# Patient Record
Sex: Male | Born: 1970
Health system: Southern US, Community
[De-identification: ages and names within clinical notes are randomized; demographics above are authoritative.]

## PROBLEM LIST (undated history)

## (undated) DIAGNOSIS — Z86718 Personal history of other venous thrombosis and embolism: Secondary | ICD-10-CM

## (undated) DIAGNOSIS — F32A Depression, unspecified: Secondary | ICD-10-CM

## (undated) DIAGNOSIS — G4730 Sleep apnea, unspecified: Secondary | ICD-10-CM

## (undated) DIAGNOSIS — R519 Headache, unspecified: Secondary | ICD-10-CM

## (undated) DIAGNOSIS — I10 Essential (primary) hypertension: Secondary | ICD-10-CM

## (undated) DIAGNOSIS — F329 Major depressive disorder, single episode, unspecified: Secondary | ICD-10-CM

## (undated) DIAGNOSIS — R011 Cardiac murmur, unspecified: Secondary | ICD-10-CM

## (undated) DIAGNOSIS — D689 Coagulation defect, unspecified: Secondary | ICD-10-CM

## (undated) DIAGNOSIS — R51 Headache: Secondary | ICD-10-CM

## (undated) DIAGNOSIS — I209 Angina pectoris, unspecified: Secondary | ICD-10-CM

## (undated) DIAGNOSIS — I639 Cerebral infarction, unspecified: Secondary | ICD-10-CM

## (undated) DIAGNOSIS — I499 Cardiac arrhythmia, unspecified: Secondary | ICD-10-CM

## (undated) DIAGNOSIS — F419 Anxiety disorder, unspecified: Secondary | ICD-10-CM

## (undated) DIAGNOSIS — R001 Bradycardia, unspecified: Secondary | ICD-10-CM

## (undated) DIAGNOSIS — T4145XA Adverse effect of unspecified anesthetic, initial encounter: Secondary | ICD-10-CM

## (undated) DIAGNOSIS — G709 Myoneural disorder, unspecified: Secondary | ICD-10-CM

## (undated) DIAGNOSIS — T8859XA Other complications of anesthesia, initial encounter: Secondary | ICD-10-CM

## (undated) DIAGNOSIS — I2699 Other pulmonary embolism without acute cor pulmonale: Secondary | ICD-10-CM

## (undated) HISTORY — DX: Coagulation defect, unspecified: D68.9

## (undated) HISTORY — PX: CARDIOVASCULAR STRESS TEST: SHX262

## (undated) HISTORY — PX: COSMETIC SURGERY: SHX468

## (undated) HISTORY — DX: Depression, unspecified: F32.A

## (undated) HISTORY — DX: Cardiac murmur, unspecified: R01.1

## (undated) HISTORY — PX: LEG SURGERY: SHX1003

## (undated) HISTORY — DX: Major depressive disorder, single episode, unspecified: F32.9

## (undated) HISTORY — PX: FRACTURE SURGERY: SHX138

---

## 2008-11-21 DIAGNOSIS — I639 Cerebral infarction, unspecified: Secondary | ICD-10-CM

## 2008-11-21 HISTORY — DX: Cerebral infarction, unspecified: I63.9

## 2012-02-02 ENCOUNTER — Emergency Department (HOSPITAL_COMMUNITY): Payer: Self-pay

## 2012-02-02 ENCOUNTER — Other Ambulatory Visit: Payer: Self-pay

## 2012-02-02 ENCOUNTER — Emergency Department (HOSPITAL_COMMUNITY)
Admission: EM | Admit: 2012-02-02 | Discharge: 2012-02-03 | Disposition: A | Discharge status: Home / Self Care | Payer: Self-pay | Attending: Emergency Medicine | Admitting: Emergency Medicine

## 2012-02-02 ENCOUNTER — Encounter (HOSPITAL_COMMUNITY): Payer: Self-pay | Admitting: Nurse Practitioner

## 2012-02-02 DIAGNOSIS — R609 Edema, unspecified: Secondary | ICD-10-CM

## 2012-02-02 DIAGNOSIS — R0789 Other chest pain: Secondary | ICD-10-CM | POA: Insufficient documentation

## 2012-02-02 DIAGNOSIS — R0602 Shortness of breath: Secondary | ICD-10-CM | POA: Insufficient documentation

## 2012-02-02 DIAGNOSIS — M7989 Other specified soft tissue disorders: Secondary | ICD-10-CM | POA: Insufficient documentation

## 2012-02-02 DIAGNOSIS — R42 Dizziness and giddiness: Secondary | ICD-10-CM | POA: Insufficient documentation

## 2012-02-02 DIAGNOSIS — R51 Headache: Secondary | ICD-10-CM | POA: Insufficient documentation

## 2012-02-02 LAB — DIFFERENTIAL
Basophils Relative: 0 % (ref 0–1)
Eosinophils Absolute: 0.2 10*3/uL (ref 0.0–0.7)
Eosinophils Relative: 3 % (ref 0–5)
Monocytes Absolute: 0.3 10*3/uL (ref 0.1–1.0)
Monocytes Relative: 5 % (ref 3–12)
Neutrophils Relative %: 49 % (ref 43–77)

## 2012-02-02 LAB — CBC
Hemoglobin: 13.8 g/dL (ref 13.0–17.0)
MCH: 32 pg (ref 26.0–34.0)
MCHC: 34.9 g/dL (ref 30.0–36.0)
MCV: 91.6 fL (ref 78.0–100.0)

## 2012-02-02 LAB — POCT I-STAT TROPONIN I

## 2012-02-02 LAB — PROTIME-INR: INR: 0.88 (ref 0.00–1.49)

## 2012-02-02 MED ORDER — IOHEXOL 350 MG/ML SOLN
80.0000 mL | Freq: Once | INTRAVENOUS | Status: AC | PRN
  Administered 2012-02-02: 80 mL via INTRAVENOUS

## 2012-02-02 MED ORDER — MORPHINE SULFATE 4 MG/ML IJ SOLN
4.0000 mg | Freq: Once | INTRAMUSCULAR | Status: AC
  Administered 2012-02-02: 4 mg via INTRAVENOUS
  Filled 2012-02-02: qty 1

## 2012-02-02 MED ORDER — NAPROXEN 500 MG PO TABS: 500.0000 mg | ORAL_TABLET | Freq: Two times a day (BID) | ORAL | Status: DC

## 2012-02-02 MED ORDER — LORAZEPAM 2 MG/ML IJ SOLN
1.0000 mg | Freq: Once | INTRAMUSCULAR | Status: AC
  Administered 2012-02-02: 1 mg via INTRAVENOUS
  Filled 2012-02-02: qty 1

## 2012-02-02 NOTE — ED Provider Notes (Signed)
Patient in CDU pending completion of workup for lower extremity swelling, left sided chest tightness, occasional shortness of breath.  Labs and radiology results reviewed and discussed with patient and family.  No abnormal findings.  Patient has a PCP in [place].  Patient to call Dr. Blanchie Dessert office tomorrow to schedule outpatient follow-up for atypical chest pain.  Jimmye Norman, NP 02/02/12 [phone]

## 2012-02-02 NOTE — Progress Notes (Signed)
VASCULAR LAB PRELIMINARY  PRELIMINARY  PRELIMINARY  PRELIMINARY  Right lower extremity venous duplex has been completed.    Preliminary report:  No obvious DVT or superficial vein thrombosis noted in the right lower extremity.   Vanna Scotland,  RVT 02/02/2012, 6:53 PM

## 2012-02-02 NOTE — Discharge Instructions (Signed)
Chest Pain (Nonspecific) It is often hard to give a specific diagnosis for the cause of chest pain. There is always a chance that your pain could be related to something serious, such as a heart attack or a blood clot in the lungs. You need to follow up with your caregiver for further evaluation. CAUSES   Heartburn.   Pneumonia or bronchitis.   Anxiety or stress.   Inflammation around your heart (pericarditis) or lung (pleuritis or pleurisy).   A blood clot in the lung.   A collapsed lung (pneumothorax). It can develop suddenly on its own (spontaneous pneumothorax) or from injury (trauma) to the chest.   Shingles infection (herpes zoster virus).  The chest wall is composed of bones, muscles, and cartilage. Any of these can be the source of the pain.  The bones can be bruised by injury.   The muscles or cartilage can be strained by coughing or overwork.   The cartilage can be affected by inflammation and become sore (costochondritis).  DIAGNOSIS  Lab tests or other studies, such as X-rays, electrocardiography, stress testing, or cardiac imaging, may be needed to find the cause of your pain.  TREATMENT   Treatment depends on what may be causing your chest pain. Treatment may include:   Acid blockers for heartburn.   Anti-inflammatory medicine.   Pain medicine for inflammatory conditions.   Antibiotics if an infection is present.   You may be advised to change lifestyle habits. This includes stopping smoking and avoiding alcohol, caffeine, and chocolate.   You may be advised to keep your head raised (elevated) when sleeping. This reduces the chance of acid going backward from your stomach into your esophagus.   Most of the time, nonspecific chest pain will improve within 2 to 3 days with rest and mild pain medicine.  HOME CARE INSTRUCTIONS   If antibiotics were prescribed, take your antibiotics as directed. Finish them even if you start to feel better.   For the next few  days, avoid physical activities that bring on chest pain. Continue physical activities as directed.   Do not smoke.   Avoid drinking alcohol.   Only take over-the-counter or prescription medicine for pain, discomfort, or fever as directed by your caregiver.   Follow your caregiver's suggestions for further testing if your chest pain does not go away.   Keep any follow-up appointments you made. If you do not go to an appointment, you could develop lasting (chronic) problems with pain. If there is any problem keeping an appointment, you must call to reschedule.  SEEK MEDICAL CARE IF:   You think you are having problems from the medicine you are taking. Read your medicine instructions carefully.   Your chest pain does not go away, even after treatment.   You develop a rash with blisters on your chest.  SEEK IMMEDIATE MEDICAL CARE IF:   You have increased chest pain or pain that spreads to your arm, neck, jaw, back, or abdomen.   You develop shortness of breath, an increasing cough, or you are coughing up blood.   You have severe back or abdominal pain, feel nauseous, or vomit.   You develop severe weakness, fainting, or chills.   You have a fever.  THIS IS AN EMERGENCY. Do not wait to see if the pain will go away. Get medical help at once. Call your local emergency services (911 in [place] Do not drive yourself to the hospital. MAKE SURE YOU:   Understand these instructions.     Will watch your condition.   Will get help right away if you are not doing well or get worse.  Document Released: 08/17/2005 Document Revised: 10/27/2011 Document Reviewed: 06/12/2008 [facility] Patient Information 2012 [place], [place].  Headache, General, Unknown Cause The specific cause of your headache may not have been found today. There are many causes and types of headache. A few common ones are:  Tension headache.   Migraine.   Infections (examples: dental and sinus infections).   Bone and/or  joint problems in the neck or jaw.   Depression.   Eye problems.  These headaches are not life threatening.  Headaches can sometimes be diagnosed by a patient history and a physical exam. Sometimes, lab and imaging studies (such as x-ray and/or CT scan) are used to rule out more serious problems. In some cases, a spinal tap (lumbar puncture) may be requested. There are many times when your exam and tests may be normal on the first visit even when there is a serious problem causing your headaches. Because of that, it is very important to follow up with your doctor or local clinic for further evaluation. FINDING OUT THE RESULTS OF TESTS  If a radiology test was performed, a radiologist will review your results.   You will be contacted by the emergency department or your physician if any test results require a change in your treatment plan.   Not all test results may be available during your visit. If your test results are not back during the visit, make an appointment with your caregiver to find out the results. Do not assume everything is normal if you have not heard from your caregiver or the medical facility. It is important for you to follow up on all of your test results.  HOME CARE INSTRUCTIONS   Keep follow-up appointments with your caregiver, or any specialist referral.   Only take over-the-counter or prescription medicines for pain, discomfort, or fever as directed by your caregiver.   Biofeedback, massage, or other relaxation techniques may be helpful.   Ice packs or heat applied to the head and neck can be used. Do this three to four times per day, or as needed.   Call your doctor if you have any questions or concerns.   If you smoke, you should quit.  SEEK MEDICAL CARE IF:   You develop problems with medications prescribed.   You do not respond to or obtain relief from medications.   You have a change from the usual headache.   You develop nausea or vomiting.  SEEK  IMMEDIATE MEDICAL CARE IF:   If your headache becomes severe.   You have an unexplained oral temperature above 102 F (38.9 C), or as your caregiver suggests.   You have a stiff neck.   You have loss of vision.   You have muscular weakness.   You have loss of muscular control.   You develop severe symptoms different from your first symptoms.   You start losing your balance or have trouble walking.   You feel faint or pass out.  MAKE SURE YOU:   Understand these instructions.   Will watch your condition.   Will get help right away if you are not doing well or get worse.  Document Released: 11/07/2005 Document Revised: 10/27/2011 Document Reviewed: 06/26/2008 [facility] Patient Information 2012 [place], [place].

## 2012-02-02 NOTE — ED Notes (Signed)
C/o cp and headache x 2 days. Has also felt SOB in the morning but then it goes away. Denies LOC, but has been dizzy. Reports pain is constant since onset. Also states L hand feels tingly. A&Ox4, speech is clear, grips = bilateral, ambulatory

## 2012-02-02 NOTE — ED Provider Notes (Addendum)
History     CSN: [id number]  Arrival date & time 02/02/12  1411   First MD Initiated Contact with Patient 02/02/12 1545      Chief Complaint  Patient presents with  . Chest Pain    (Consider location/radiation/quality/duration/timing/severity/associated sxs/prior treatment) HPI Complains of anterior chest pain left sided gradual in onset 3 days ago described as tightness with tingling in his left hand. Pain is nonradiating. Admits to shortness of breath, none now. Pain is not worse with exertion. Shortness of breath is somewhat worsened with sitting up. Also reports swelling of his right leg since yesterday, and headache for several days, diffuse presently headache is worse chest pain. No treatment prior to coming here no other associated symptoms. History reviewed. No pertinent past medical history. Past medical history DVT History reviewed. No pertinent past surgical history. Surgical history skin graft History reviewed. No pertinent family history. Crackers factors male gender otherwise negative History  Substance Use Topics  . Smoking status: Never Smoker   . Smokeless tobacco: Not on file  . Alcohol Use: Yes     social      Review of Systems  Respiratory: Positive for chest tightness and shortness of breath.   Neurological: Positive for dizziness and headaches.  All other systems reviewed and are negative.    Allergies  Review of patient's allergies indicates no known allergies.  Home Medications  No current outpatient prescriptions on file.  BP 156/94  Pulse 90  Temp(Src) 98.4 F (36.9 C) (Oral)  Resp 19  Ht 5\' 11"  (1.803 m)  Wt 210 lb (95.255 kg)  BMI 29.29 kg/m2  SpO2 98%  Physical Exam  Nursing note and vitals reviewed. Constitutional: He is oriented to person, place, and time. He appears well-developed and well-nourished.  HENT:  Head: Normocephalic and atraumatic.  Eyes: Conjunctivae are normal. Pupils are equal, round, and reactive to light.    Neck: Neck supple. No tracheal deviation present. No thyromegaly present.  Cardiovascular: Normal rate and regular rhythm.   No murmur heard. Pulmonary/Chest: Effort normal and breath sounds normal.  Abdominal: Soft. Bowel sounds are normal. He exhibits no distension. There is no tenderness.  Musculoskeletal: Normal range of motion. He exhibits no edema and no tenderness.       Right lower extremity is swollen below the knee nontender, neurovascular intact all other extremities without swelling redness or tenderness neurovascular intact  Neurological: He is alert and oriented to person, place, and time. No cranial nerve deficit. Coordination normal.  Skin: Skin is warm and dry. No rash noted.  Psychiatric: He has a normal mood and affect.    ED Course  Procedures (including critical care time)  Date: 02/02/2012  Rate: 85  Rhythm: normal sinus rhythm  QRS Axis: normal  Intervals: normal  ST/T Wave abnormalities: nonspecific ST/T changes  Conduction Disutrbances:none  Narrative Interpretation:   Old EKG Reviewed: none available  Labs Reviewed - No data to display No results found.   No diagnosis found.  MDM  Symptoms atypical for acute coronary syndrome admission male with only risk factor being male gender Concerning for hospital thromboembolic disease given history DVT and swollen leg Headache felt not to be nonspecific. In light of atypical symptoms nonspecific EKG feel that patient can have outpatient cardiac workup. Spoke with Dr.Hilty who agrees to see patient in close followup Dx#1 atypical chest pain #2 nonspecific headache          Doug Sou, MD 02/02/12 1631  Doug Sou, MD 02/02/12  2027 
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
## 2012-02-06 NOTE — ED Provider Notes (Signed)
Medical screening examination/treatment/procedure(s) were conducted as a shared visit with non-physician practitioner(s) and myself.  I personally evaluated the patient during the encounter  Doug Sou, MD 02/06/12 [phone]

## 2012-10-21 ENCOUNTER — Encounter (HOSPITAL_COMMUNITY): Payer: Self-pay | Admitting: *Deleted

## 2012-10-21 ENCOUNTER — Emergency Department (HOSPITAL_COMMUNITY)
Admission: EM | Admit: 2012-10-21 | Discharge: 2012-10-21 | Disposition: A | Discharge status: Home / Self Care | Payer: Self-pay | Attending: Emergency Medicine | Admitting: Emergency Medicine

## 2012-10-21 DIAGNOSIS — R51 Headache: Secondary | ICD-10-CM | POA: Insufficient documentation

## 2012-10-21 DIAGNOSIS — H73019 Bullous myringitis, unspecified ear: Secondary | ICD-10-CM | POA: Insufficient documentation

## 2012-10-21 MED ORDER — AZITHROMYCIN 250 MG PO TABS: ORAL_TABLET | ORAL | Status: DC

## 2012-10-21 MED ORDER — ANTIPYRINE-BENZOCAINE 5.4-1.4 % OT SOLN
3.0000 [drp] | Freq: Once | OTIC | Status: AC
  Administered 2012-10-21: 4 [drp] via OTIC
  Filled 2012-10-21: qty 10

## 2012-10-21 NOTE — ED Notes (Signed)
Pt reports having right ear and head pain  1 week.

## 2012-10-21 NOTE — ED Provider Notes (Signed)
History   This chart was scribed for Ethelda Chick, MD by Melba Coon, ED Scribe. The patient was seen in room TR10C/TR10C and the patient's care was started at 6:34PM.    CSN: [id number]  Arrival date & time 10/21/12  1630   None     Chief Complaint  Patient presents with  . Otalgia  . Headache    (Consider location/radiation/quality/duration/timing/severity/associated sxs/prior treatment) Patient is a 41 y.o. male presenting with ear pain and headaches. The history is provided by the patient. No language interpreter was used.  Otalgia This is a new problem. Episode onset: a week ago. There is pain in the right ear. The problem occurs constantly. The problem has been gradually worsening. There has been no fever. The pain is moderate. Associated symptoms include headaches.  Headache  The pain is located in the occipital and right unilateral region.  He reports that the otalgia started first then the headache came after. He reports that his hearing is muffled in his right ear. Denies neck pain, rhinorrhea, congestion, sore throat, rash, back pain, CP, SOB, abdominal pain, nausea, emesis, diarrhea, dysuria, or extremity pain, edema, weakness, numbness, or tingling. No known allergies. No other pertinent medical symptoms.  History reviewed. No pertinent past medical history.  History reviewed. No pertinent past surgical history.  History reviewed. No pertinent family history.  History  Substance Use Topics  . Smoking status: Never Smoker   . Smokeless tobacco: Not on file  . Alcohol Use: Yes     Comment: social      Review of Systems  HENT: Positive for ear pain.   Neurological: Positive for headaches.   10 Systems reviewed and all are negative for acute change except as noted in the HPI.   Allergies  Review of patient's allergies indicates no known allergies.  Home Medications   Current Outpatient Rx  Name  Route  Sig  Dispense  Refill  . AZITHROMYCIN 250 MG  PO TABS      Take 2 tabs po on the first day, then 1 tab po qD x days 2-5, total of 5 day course   6 each   0     BP 114/91  Pulse 71  Temp 98 F (36.7 C) (Oral)  Resp 16  SpO2 99%  Physical Exam  Nursing note and vitals reviewed. Constitutional:       Awake, alert, nontoxic appearance.  HENT:  Head: Atraumatic.       Bullous myringitis of right TM.  Eyes: Conjunctivae normal and EOM are normal. Pupils are equal, round, and reactive to light. Right eye exhibits no discharge. Left eye exhibits no discharge.  Neck: Neck supple.  Cardiovascular: Normal rate, regular rhythm and normal heart sounds.   No murmur heard. Pulmonary/Chest: Effort normal and breath sounds normal. No respiratory distress. He exhibits no tenderness.  Abdominal: Soft. There is no tenderness. There is no rebound.  Musculoskeletal: He exhibits no tenderness.       Baseline ROM, no obvious new focal weakness.  Neurological:       Mental status and motor strength appears baseline for patient and situation.  Skin: No rash noted.  Psychiatric: He has a normal mood and affect.    ED Course  Procedures (including critical care time)  COORDINATION OF CARE:  6:37PM -  Ear drops and Zithromax will be Rx for [employer]. He is advised to take an OTC decongestant at home and is ready for d/c.    Labs  Reviewed - No data to display No results found.   1. Bullous myringitis       MDM  Pt presents with c/o right ear pain with referred pain to right side of head.  Exam c/w bullous myringitis. Pt started on zithromax.  Given auralgan for pain.  Discharged with strict return precautions.  Pt agreeable with plan.    I personally performed the services described in this documentation, which was scribed in my presence. The recorded information has been reviewed and is accurate.        Ethelda Chick, MD 10/23/12 [phone]

## 2012-10-23 ENCOUNTER — Emergency Department (HOSPITAL_COMMUNITY)
Admission: EM | Admit: 2012-10-23 | Discharge: 2012-10-23 | Disposition: A | Discharge status: Home / Self Care | Payer: Self-pay | Attending: Emergency Medicine | Admitting: Emergency Medicine

## 2012-10-23 ENCOUNTER — Encounter (HOSPITAL_COMMUNITY): Payer: Self-pay | Admitting: Emergency Medicine

## 2012-10-23 DIAGNOSIS — H9209 Otalgia, unspecified ear: Secondary | ICD-10-CM | POA: Insufficient documentation

## 2012-10-23 MED ORDER — NEOMYCIN-COLIST-HC-THONZONIUM 3.3-3-10-0.5 MG/ML OT SUSP
4.0000 [drp] | Freq: Four times a day (QID) | OTIC | Status: DC
  Filled 2012-10-23: qty 5

## 2012-10-23 MED ORDER — TRAMADOL HCL 50 MG PO TABS: 50.0000 mg | ORAL_TABLET | Freq: Three times a day (TID) | ORAL | Status: DC | PRN

## 2012-10-23 MED ORDER — NEOMYCIN-POLYMYXIN-HC 3.5-10000-1 OT SOLN
4.0000 [drp] | Freq: Four times a day (QID) | OTIC | Status: DC
  Filled 2012-10-23: qty 10

## 2012-10-23 NOTE — ED Provider Notes (Signed)
History   This chart was scribed for [name] Munch, MD, MD by Smitty Pluck, ED Scribe. The patient was seen in room [facility] and the patient's care was started at 11:49AM.    CSN: [id number]  Arrival date & time 10/23/12  1102   None     No chief complaint on file.    The history is provided by the patient. No language interpreter was used.   [name] [name] is a 41 y.o. male who presents to the Emergency Department complaining of constant, moderate right otalgia onset 1 week ago. Pt reports that he was seen in ED 10/21/12 for same, diagnosed with right ear infection and given ear drops and zithromax. He reports that symptoms have not improved. He reports that the pain is radiating to right neck and temporal lobe. He denies fevers, chills, nausea, vomiting, diarrhea and any other pain.   History reviewed. No pertinent past medical history.  History reviewed. No pertinent past surgical history.  History reviewed. No pertinent family history.  History  Substance Use Topics  . Smoking status: Never Smoker   . Smokeless tobacco: Not on file  . Alcohol Use: Yes     Comment: social      Review of Systems  Constitutional:       Per HPI, otherwise negative  HENT:       Per HPI, otherwise negative  Eyes: Negative.   Respiratory:       Per HPI, otherwise negative  Cardiovascular:       Per HPI, otherwise negative  Gastrointestinal: Negative for vomiting.  Genitourinary: Negative.   Musculoskeletal:       Per HPI, otherwise negative  Skin: Negative.   Neurological: Negative for syncope.    Allergies  Review of patient's allergies indicates no known allergies.  Home Medications   Current Outpatient Rx  Name  Route  Sig  Dispense  Refill  . AZITHROMYCIN 250 MG PO TABS      Take 2 tabs po on the first day, then 1 tab po qD x days 2-5, total of 5 day course   6 each   0     BP 125/82  Pulse 74  Temp 98 F (36.7 C) (Oral)  Resp 18  SpO2 97%  Physical Exam   Nursing note and vitals reviewed. Constitutional: He is oriented to person, place, and time. He appears well-developed. No distress.  HENT:  Head: Normocephalic and atraumatic.  Mouth/Throat: Uvula is midline.       Fullness around mandible but no palpable lymph nodes  Mild discharge from right ear The right canal is non erythematous Right TM appears to be healing   No skull or mastoid tenderness Tenderness around angle of mandible   Eyes: Conjunctivae normal and EOM are normal.  Cardiovascular: Normal rate and regular rhythm.   Pulmonary/Chest: Effort normal. No stridor. No respiratory distress.  Abdominal: He exhibits no distension.  Musculoskeletal: He exhibits no edema.  Neurological: He is alert and oriented to person, place, and time.  Skin: Skin is warm and dry.  Psychiatric: He has a normal mood and affect.    ED Course  Procedures (including critical care time) DIAGNOSTIC STUDIES:   COORDINATION OF CARE: 11:52 AM Discussed ED treatment with pt     Labs Reviewed - No data to display No results found.   No diagnosis found.  I reviewed the patient's chart from his visit 2 days ago.  MDM  I personally performed the services described in  this documentation, which was scribed in my presence. The recorded information has been reviewed and is accurate.  Patient presents with concerns of ongoing right ear pain.  On exam he is in no distress, afebrile, not tachycardic.  There is no superficial erythema, minimal edema, no mastoid tenderness, no oral pharyngeal findings, thus little suspicion for progression of acute infection.  We discussed additional return precautions, the need for ENT followup for continued management of his condition.  I added topical antibiotics, anti-inflammatories to his medication regimen, and he was stable for discharge.   [name] Munch, MD 10/23/12 1204

## 2012-10-23 NOTE — ED Notes (Signed)
Pt has cotton ball in right ear, rates pain at ear 9/10. States when he stands up really fast right ear feels a lot of pressure, currently pt is supine and states his right temporal area of head feels like pins and needles. Pain extends down to right neck and right shoulder blade. Pt is A&Ox4, ambulatory, nad.

## 2012-10-23 NOTE — ED Notes (Signed)
Pt c/o right side earache and pain into side of face with head congestion

## 2013-04-23 ENCOUNTER — Encounter (HOSPITAL_COMMUNITY): Payer: Self-pay | Admitting: *Deleted

## 2013-04-23 DIAGNOSIS — I951 Orthostatic hypotension: Secondary | ICD-10-CM | POA: Insufficient documentation

## 2013-04-23 DIAGNOSIS — R059 Cough, unspecified: Secondary | ICD-10-CM | POA: Insufficient documentation

## 2013-04-23 DIAGNOSIS — R209 Unspecified disturbances of skin sensation: Secondary | ICD-10-CM | POA: Insufficient documentation

## 2013-04-23 DIAGNOSIS — R079 Chest pain, unspecified: Secondary | ICD-10-CM | POA: Insufficient documentation

## 2013-04-23 DIAGNOSIS — R791 Abnormal coagulation profile: Secondary | ICD-10-CM | POA: Insufficient documentation

## 2013-04-23 DIAGNOSIS — Z7901 Long term (current) use of anticoagulants: Secondary | ICD-10-CM | POA: Insufficient documentation

## 2013-04-23 DIAGNOSIS — R05 Cough: Secondary | ICD-10-CM | POA: Insufficient documentation

## 2013-04-23 DIAGNOSIS — E860 Dehydration: Secondary | ICD-10-CM | POA: Insufficient documentation

## 2013-04-23 DIAGNOSIS — R55 Syncope and collapse: Secondary | ICD-10-CM | POA: Insufficient documentation

## 2013-04-23 DIAGNOSIS — Z86718 Personal history of other venous thrombosis and embolism: Secondary | ICD-10-CM | POA: Insufficient documentation

## 2013-04-23 LAB — CBC WITH DIFFERENTIAL/PLATELET
HCT: 39.6 % (ref 39.0–52.0)
Hemoglobin: 13.9 g/dL (ref 13.0–17.0)
Lymphocytes Relative: 38 % (ref 12–46)
Lymphs Abs: 2.5 10*3/uL (ref 0.7–4.0)
Monocytes Absolute: 0.4 10*3/uL (ref 0.1–1.0)
Monocytes Relative: 6 % (ref 3–12)
Neutro Abs: 3.7 10*3/uL (ref 1.7–7.7)
Neutrophils Relative %: 55 % (ref 43–77)
RBC: 4.32 MIL/uL (ref 4.22–5.81)
WBC: 6.7 10*3/uL (ref 4.0–10.5)

## 2013-04-23 LAB — COMPREHENSIVE METABOLIC PANEL
Alkaline Phosphatase: 55 U/L (ref 39–117)
BUN: 19 mg/dL (ref 6–23)
CO2: 31 mEq/L (ref 19–32)
Chloride: 103 mEq/L (ref 96–112)
Creatinine, Ser: 1.16 mg/dL (ref 0.50–1.35)
GFR calc non Af Amer: 76 mL/min — ABNORMAL LOW (ref >90)
Potassium: 4.2 mEq/L (ref 3.5–5.1)
Total Bilirubin: 0.4 mg/dL (ref 0.3–1.2)

## 2013-04-23 LAB — PROTIME-INR
INR: 1.21 (ref 0.00–1.49)
Prothrombin Time: 15.1 seconds (ref 11.6–15.2)

## 2013-04-23 NOTE — ED Notes (Signed)
The pt is c/o dizziness chest pain coughing up blood numbness in the lt arm and he has a dvt in his rt leg.  He has had symptoms for 2-3 weeks.  He was admitted and discharged from a hosp in [place] may 30th

## 2013-04-23 NOTE — ED Notes (Signed)
NURSE FIRST ROUNDS : NURSE UPDATED PT. ON WAIT TIME , PROCESS AND DELAY , DENIES PAIN AT THIS TIME / RESPIRATIONS UNLABORED.

## 2013-04-24 ENCOUNTER — Emergency Department (HOSPITAL_COMMUNITY): Payer: Self-pay

## 2013-04-24 ENCOUNTER — Emergency Department (HOSPITAL_COMMUNITY)
Admission: EM | Admit: 2013-04-24 | Discharge: 2013-04-24 | Disposition: A | Discharge status: Home / Self Care | Payer: Self-pay | Attending: Emergency Medicine | Admitting: Emergency Medicine

## 2013-04-24 DIAGNOSIS — R791 Abnormal coagulation profile: Secondary | ICD-10-CM

## 2013-04-24 DIAGNOSIS — R55 Syncope and collapse: Secondary | ICD-10-CM

## 2013-04-24 DIAGNOSIS — I951 Orthostatic hypotension: Secondary | ICD-10-CM

## 2013-04-24 DIAGNOSIS — E860 Dehydration: Secondary | ICD-10-CM

## 2013-04-24 DIAGNOSIS — R05 Cough: Secondary | ICD-10-CM

## 2013-04-24 LAB — TROPONIN I: Troponin I: <0.3 ng/mL (ref <0.30)

## 2013-04-24 MED ORDER — ENOXAPARIN SODIUM 100 MG/ML ~~LOC~~ SOLN: 100.0000 mg | Freq: Every day | SUBCUTANEOUS | Status: DC

## 2013-04-24 MED ORDER — SODIUM CHLORIDE 0.9 % IV BOLUS (SEPSIS)
2000.0000 mL | Freq: Once | INTRAVENOUS | Status: AC
  Administered 2013-04-24: 2000 mL via INTRAVENOUS

## 2013-04-24 MED ORDER — ENOXAPARIN SODIUM 100 MG/ML ~~LOC~~ SOLN
100.0000 mg | Freq: Once | SUBCUTANEOUS | Status: AC
  Administered 2013-04-24: 100 mg via SUBCUTANEOUS
  Filled 2013-04-24: qty 1

## 2013-04-24 MED ORDER — WARFARIN SODIUM 10 MG PO TABS
10.0000 mg | ORAL_TABLET | Freq: Once | ORAL | Status: AC
  Administered 2013-04-24: 10 mg via ORAL
  Filled 2013-04-24: qty 1

## 2013-04-24 MED ORDER — BENZONATATE 100 MG PO CAPS: 100.0000 mg | ORAL_CAPSULE | Freq: Three times a day (TID) | ORAL | Status: DC

## 2013-04-24 MED ORDER — IOHEXOL 350 MG/ML SOLN
100.0000 mL | Freq: Once | INTRAVENOUS | Status: AC | PRN
  Administered 2013-04-24: 100 mL via INTRAVENOUS

## 2013-04-24 MED ORDER — WARFARIN SODIUM 5 MG PO TABS: 10.0000 mg | ORAL_TABLET | Freq: Every day | ORAL | Status: DC

## 2013-04-24 MED ORDER — WARFARIN - PHYSICIAN DOSING INPATIENT: Freq: Every day | Status: DC
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
## 2013-07-08 ENCOUNTER — Emergency Department (HOSPITAL_COMMUNITY): Payer: Self-pay

## 2013-07-08 ENCOUNTER — Encounter (HOSPITAL_COMMUNITY): Payer: Self-pay | Admitting: *Deleted

## 2013-07-08 ENCOUNTER — Emergency Department (HOSPITAL_COMMUNITY)
Admission: EM | Admit: 2013-07-08 | Discharge: 2013-07-09 | Disposition: A | Discharge status: Home / Self Care | Payer: Self-pay | Attending: Emergency Medicine | Admitting: Emergency Medicine

## 2013-07-08 DIAGNOSIS — M79609 Pain in unspecified limb: Secondary | ICD-10-CM | POA: Insufficient documentation

## 2013-07-08 DIAGNOSIS — M7989 Other specified soft tissue disorders: Secondary | ICD-10-CM | POA: Insufficient documentation

## 2013-07-08 DIAGNOSIS — R42 Dizziness and giddiness: Secondary | ICD-10-CM | POA: Insufficient documentation

## 2013-07-08 DIAGNOSIS — Z86718 Personal history of other venous thrombosis and embolism: Secondary | ICD-10-CM | POA: Insufficient documentation

## 2013-07-08 DIAGNOSIS — R0602 Shortness of breath: Secondary | ICD-10-CM | POA: Insufficient documentation

## 2013-07-08 DIAGNOSIS — R109 Unspecified abdominal pain: Secondary | ICD-10-CM | POA: Insufficient documentation

## 2013-07-08 DIAGNOSIS — Z7901 Long term (current) use of anticoagulants: Secondary | ICD-10-CM | POA: Insufficient documentation

## 2013-07-08 DIAGNOSIS — R110 Nausea: Secondary | ICD-10-CM | POA: Insufficient documentation

## 2013-07-08 DIAGNOSIS — R079 Chest pain, unspecified: Secondary | ICD-10-CM

## 2013-07-08 DIAGNOSIS — R042 Hemoptysis: Secondary | ICD-10-CM

## 2013-07-08 HISTORY — DX: Personal history of other venous thrombosis and embolism: Z86.718

## 2013-07-08 LAB — BASIC METABOLIC PANEL
BUN: 17 mg/dL (ref 6–23)
Creatinine, Ser: 1.05 mg/dL (ref 0.50–1.35)
GFR calc Af Amer: >90 mL/min (ref >90)
GFR calc non Af Amer: 86 mL/min — ABNORMAL LOW (ref >90)
Potassium: 4.2 mEq/L (ref 3.5–5.1)

## 2013-07-08 LAB — HEPATIC FUNCTION PANEL
ALT: 55 U/L — ABNORMAL HIGH (ref 0–53)
AST: 26 U/L (ref 0–37)
Bilirubin, Direct: 0.1 mg/dL (ref 0.0–0.3)
Total Bilirubin: 0.6 mg/dL (ref 0.3–1.2)

## 2013-07-08 LAB — CBC
HCT: 41.2 % (ref 39.0–52.0)
MCHC: 35.7 g/dL (ref 30.0–36.0)
MCV: 96.9 fL (ref 78.0–100.0)
RDW: 15.6 % — ABNORMAL HIGH (ref 11.5–15.5)

## 2013-07-08 MED ORDER — MORPHINE SULFATE 4 MG/ML IJ SOLN
4.0000 mg | Freq: Once | INTRAMUSCULAR | Status: AC
  Administered 2013-07-08: 4 mg via INTRAVENOUS
  Filled 2013-07-08: qty 1

## 2013-07-08 MED ORDER — IOHEXOL 350 MG/ML SOLN
100.0000 mL | Freq: Once | INTRAVENOUS | Status: AC | PRN
  Administered 2013-07-08: 100 mL via INTRAVENOUS
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
## 2013-07-08 NOTE — ED Notes (Signed)
Pt now reports chest pain

## 2013-07-08 NOTE — ED Notes (Signed)
Pt states today spitting up blood, orange colored urine, and having right groin upper thigh pain.  Pt is on lovenox and coumadin for right leg DVT..  Pulse RLE present.

## 2013-07-09 DIAGNOSIS — R079 Chest pain, unspecified: Secondary | ICD-10-CM

## 2013-07-09 MED ORDER — IBUPROFEN 800 MG PO TABS: 800.0000 mg | ORAL_TABLET | Freq: Three times a day (TID) | ORAL | Status: DC | PRN
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
## 2013-07-09 NOTE — ED Provider Notes (Signed)
Medical screening examination/treatment/procedure(s) were conducted as a shared visit with resident physician and myself.  I personally evaluated the patient during the encounter.  I examined and interviewed the pt. Atypical cp, doubt ACS w/ few RF's. Ruled out PE and cards consulted for concerning ecg possibly cw pericarditis. Workup neg thus far. Will have pt get close f/u w/ pcp and return for worsening.   Junius Argyle, MD 07/09/13 1137

## 2013-07-13 ENCOUNTER — Emergency Department (HOSPITAL_COMMUNITY): Payer: Self-pay

## 2013-07-13 ENCOUNTER — Emergency Department (HOSPITAL_COMMUNITY)
Admission: EM | Admit: 2013-07-13 | Discharge: 2013-07-13 | Disposition: A | Discharge status: Home / Self Care | Payer: Self-pay | Attending: Emergency Medicine | Admitting: Emergency Medicine

## 2013-07-13 ENCOUNTER — Encounter (HOSPITAL_COMMUNITY): Payer: Self-pay | Admitting: *Deleted

## 2013-07-13 DIAGNOSIS — Z79899 Other long term (current) drug therapy: Secondary | ICD-10-CM | POA: Insufficient documentation

## 2013-07-13 DIAGNOSIS — R509 Fever, unspecified: Secondary | ICD-10-CM | POA: Insufficient documentation

## 2013-07-13 DIAGNOSIS — Z86718 Personal history of other venous thrombosis and embolism: Secondary | ICD-10-CM | POA: Insufficient documentation

## 2013-07-13 DIAGNOSIS — Z7901 Long term (current) use of anticoagulants: Secondary | ICD-10-CM | POA: Insufficient documentation

## 2013-07-13 DIAGNOSIS — G8929 Other chronic pain: Secondary | ICD-10-CM | POA: Insufficient documentation

## 2013-07-13 LAB — CBC WITH DIFFERENTIAL/PLATELET
Eosinophils Absolute: 0.3 10*3/uL (ref 0.0–0.7)
Eosinophils Relative: 4 % (ref 0–5)
Lymphs Abs: 2.1 10*3/uL (ref 0.7–4.0)
MCH: 34.2 pg — ABNORMAL HIGH (ref 26.0–34.0)
MCV: 98.6 fL (ref 78.0–100.0)
Platelets: 282 10*3/uL (ref 150–400)
RBC: 3.65 MIL/uL — ABNORMAL LOW (ref 4.22–5.81)
RDW: 15.4 % (ref 11.5–15.5)

## 2013-07-13 LAB — URINALYSIS, ROUTINE W REFLEX MICROSCOPIC
Bilirubin Urine: NEGATIVE
Hgb urine dipstick: NEGATIVE
Protein, ur: NEGATIVE mg/dL
Urobilinogen, UA: 1 mg/dL (ref 0.0–1.0)

## 2013-07-13 MED ORDER — HYDROCODONE-ACETAMINOPHEN 5-325 MG PO TABS: 1.0000 | ORAL_TABLET | Freq: Three times a day (TID) | ORAL | Status: DC | PRN
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
## 2013-07-13 NOTE — ED Notes (Signed)
Pt c/o Fever, HA, chills, and rt leg pain. Denies trauma to the leg.

## 2013-07-13 NOTE — ED Notes (Signed)
Pt here to be evaulated for fever and right thigh pain. Pt sts taking temperature at home, as high as 101.17F, has been having chills starting today. Did not take any medication. Pt also c/o right leg pain- throbbing and aching 9/10. 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 sts pain does not seem r/t DVT because "it just started today". Pt takes coumadin and lovenox for DVT, and hydrocodone for pain.

## 2013-07-14 NOTE — ED Provider Notes (Signed)
Medical screening examination/treatment/procedure(s) were performed by non-physician practitioner and as supervising physician I was immediately available for consultation/collaboration.   Shanna Cisco, MD 07/14/13 1055

## 2013-07-17 ENCOUNTER — Encounter (HOSPITAL_COMMUNITY): Payer: Self-pay | Admitting: Emergency Medicine

## 2013-07-17 ENCOUNTER — Emergency Department (HOSPITAL_COMMUNITY)
Admission: EM | Admit: 2013-07-17 | Discharge: 2013-07-17 | Disposition: A | Discharge status: Home / Self Care | Payer: Self-pay | Attending: Emergency Medicine | Admitting: Emergency Medicine

## 2013-07-17 ENCOUNTER — Emergency Department (HOSPITAL_COMMUNITY): Payer: Self-pay

## 2013-07-17 DIAGNOSIS — Z7901 Long term (current) use of anticoagulants: Secondary | ICD-10-CM | POA: Insufficient documentation

## 2013-07-17 DIAGNOSIS — Z9889 Other specified postprocedural states: Secondary | ICD-10-CM | POA: Insufficient documentation

## 2013-07-17 DIAGNOSIS — Z86718 Personal history of other venous thrombosis and embolism: Secondary | ICD-10-CM | POA: Insufficient documentation

## 2013-07-17 DIAGNOSIS — S93601A Unspecified sprain of right foot, initial encounter: Secondary | ICD-10-CM

## 2013-07-17 DIAGNOSIS — X500XXA Overexertion from strenuous movement or load, initial encounter: Secondary | ICD-10-CM | POA: Insufficient documentation

## 2013-07-17 DIAGNOSIS — Z86711 Personal history of pulmonary embolism: Secondary | ICD-10-CM | POA: Insufficient documentation

## 2013-07-17 DIAGNOSIS — Y9301 Activity, walking, marching and hiking: Secondary | ICD-10-CM | POA: Insufficient documentation

## 2013-07-17 DIAGNOSIS — Z79899 Other long term (current) drug therapy: Secondary | ICD-10-CM | POA: Insufficient documentation

## 2013-07-17 DIAGNOSIS — Y9289 Other specified places as the place of occurrence of the external cause: Secondary | ICD-10-CM | POA: Insufficient documentation

## 2013-07-17 DIAGNOSIS — Z880 Allergy status to penicillin: Secondary | ICD-10-CM | POA: Insufficient documentation

## 2013-07-17 DIAGNOSIS — S93609A Unspecified sprain of unspecified foot, initial encounter: Secondary | ICD-10-CM | POA: Insufficient documentation

## 2013-07-17 DIAGNOSIS — Z760 Encounter for issue of repeat prescription: Secondary | ICD-10-CM | POA: Insufficient documentation

## 2013-07-17 MED ORDER — RIVAROXABAN 15 MG PO TABS: 15.0000 mg | ORAL_TABLET | Freq: Two times a day (BID) | ORAL | Status: DC

## 2013-07-17 MED ORDER — KETOROLAC TROMETHAMINE 60 MG/2ML IM SOLN
60.0000 mg | Freq: Once | INTRAMUSCULAR | Status: AC
  Administered 2013-07-17: 60 mg via INTRAMUSCULAR
  Filled 2013-07-17: qty 2

## 2013-07-17 MED ORDER — PROMETHAZINE HCL 25 MG PO TABS: 25.0000 mg | ORAL_TABLET | Freq: Four times a day (QID) | ORAL | Status: DC | PRN

## 2013-07-17 MED ORDER — RIVAROXABAN 15 MG PO TABS
15.0000 mg | ORAL_TABLET | Freq: Once | ORAL | Status: AC
  Administered 2013-07-17: 15 mg via ORAL
  Filled 2013-07-17 (×2): qty 1

## 2013-07-17 MED ORDER — HYDROCODONE-ACETAMINOPHEN 5-325 MG PO TABS: 1.0000 | ORAL_TABLET | Freq: Four times a day (QID) | ORAL | Status: DC | PRN

## 2013-07-17 NOTE — ED Provider Notes (Signed)
CSN: [id number]     Arrival date & time 07/17/13  0144 History   First MD Initiated Contact with Patient 07/17/13 0240     Chief Complaint  Patient presents with  . Foot Injury   (Consider location/radiation/quality/duration/timing/severity/associated sxs/prior Treatment) HPI Comments: Patient is a 42 year old male who presents today with right foot pain after stepping on a loose vent last night. The pain is throbbing and worse with walking and palpation. When he walks on only his heel his foot does not bother him. He has taken advil with no relief of the pain. He has hx of DVT in affected leg. He is currently anticoagulated with lovenox and compliant with therapy. He denies numbness or weakness. No fevers, chills, nausea, vomiting, shortness of breath.   The history is provided by the patient. No language interpreter was used.    Past Medical History  Diagnosis Date  . H/O blood clots    Past Surgical History  Procedure Laterality Date  . Leg surgery     No family history on file. History  Substance Use Topics  . Smoking status: Never Smoker   . Smokeless tobacco: Not on file  . Alcohol Use: Yes     Comment: social    Review of Systems  Constitutional: Negative for fever and chills.  Respiratory: Negative for shortness of breath.   Cardiovascular: Negative for chest pain.  Gastrointestinal: Negative for nausea, vomiting and abdominal pain.  Musculoskeletal: Positive for myalgias, joint swelling, arthralgias and gait problem.  All other systems reviewed and are negative.    Allergies  Amoxicillin and Darvocet  Home Medications   Current Outpatient Rx  Name  Route  Sig  Dispense  Refill  . enoxaparin (LOVENOX) 80 MG/0.8ML injection   Subcutaneous   Inject 80 mg into the skin every 12 (twelve) hours.         Marland Kitchen HYDROcodone-acetaminophen (NORCO/VICODIN) 5-325 MG per tablet   Oral   Take 1 tablet by mouth every 8 (eight) hours as needed for pain.   10 tablet   0    . nitroGLYCERIN (NITROSTAT) 0.4 MG SL tablet   Sublingual   Place 0.4 mg under the tongue every 5 (five) minutes as needed for chest pain.         Marland Kitchen warfarin (COUMADIN) 10 MG tablet   Oral   Take 10 mg by mouth daily.          BP 123/74  Pulse 65  Temp(Src) 97.7 F (36.5 C) (Oral)  Ht 6' (1.829 m)  Wt 173 lb (78.472 kg)  BMI 23.46 kg/m2  SpO2 100% Physical Exam  Nursing note and vitals reviewed. Constitutional: He is oriented to person, place, and time. He appears well-developed and well-nourished. No distress.  HENT:  Head: Normocephalic and atraumatic.  Right Ear: External ear normal.  Left Ear: External ear normal.  Nose: Nose normal.  Eyes: Conjunctivae are normal.  Neck: Normal range of motion. No tracheal deviation present.  Cardiovascular: Normal rate, regular rhythm and normal heart sounds.   Pulmonary/Chest: Effort normal and breath sounds normal. No stridor.  Abdominal: Soft. He exhibits no distension. There is no tenderness.  Musculoskeletal: Normal range of motion.  Edema to right calf. 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 TTP on medial aspect of right foot. No tenderness to palpation over talvonavicular joint. Neurovascularly intact. Compartment soft.   Neurological: He is alert and oriented to person, place, and time.  Skin: Skin is warm and dry.  He is not diaphoretic.  Psychiatric: He has a normal mood and affect. His behavior is normal.    ED Course  Procedures (including critical care time) Labs Review Labs Reviewed - No data to display Imaging Review Dg Foot Complete Right  07/17/2013   *RADIOLOGY REPORT*  Clinical Data: Medial foot pain.  RIGHT FOOT COMPLETE - 3+ VIEW  Comparison: None.  Findings: Hallux valgus and mild first MTP DJD.  Small calcific density along the superior margin of the talonavicular joint on the lateral view.  Otherwise, no displaced acute fracture.  No dislocation. No aggressive osseous lesions.  IMPRESSION: Small  calcific density projecting over the talonavicular joint on the lateral view is nonspecific.  Correlate for point tenderness for a small fracture fragment.   Original Report Authenticated By: Jearld Lesch, M.D.    MDM   1. Foot sprain, right, initial encounter    Imaging shows no fracture. Directed pt to ice injury, take acetaminophen or ibuprofen for pain, and to elevate and rest the injury when possible. Edema to right calf, but this is chronic. Pt with known DVT. He is anticoagulated. Neurovascularly intact. Compartment soft. Given crutches and post op shoe for comfort. Return instructions given. Vital signs stable for discharge. Patient / Family / Caregiver informed of clinical course, understand medical decision-making process, and agree with plan.     Mora Bellman, PA-C 07/17/13 [phone]

## 2013-07-17 NOTE — ED Notes (Signed)
PT. REPORTS STEPPED ON A VENT LAST NIGHT AND TWISTED HIS RIGHT FOOT WITH PAIN AND SWELLING.

## 2013-07-17 NOTE — ED Notes (Signed)
Pt here for refill on his Lovenox. Pt unable to afford Lovenox. Case Manager notified and has spoken with pt regarding his concerns.

## 2013-07-17 NOTE — ED Provider Notes (Signed)
CSN: [id number]     Arrival date & time 07/17/13  2131 History  This chart was scribed for Dierdre Forth, PA working with Shon Baton, MD by Quintella Reichert, ED Scribe. This patient was seen in room TR08C/TR08C and the patient's care was started at 10:16 PM.    Chief Complaint  Patient presents with  . Medication Refill    The history is provided by the patient. No language interpreter was used.    HPI Comments: Baylen Buckner is a 42 y.o. male with h/o chronic DVT and PE (who reports Dx of new DVT in right leg during a hospitalization in July) who presents to the Emergency Department complaining of running out of his anticoagulant medication tonight and requesting a refill.  Pt has h/o DVT in his right leg since he was 42 years old and has been taking Coumadin and Lovenox for "a long time."  He states that he was hospitalized for a 2nd DVT for 3 days in July in [place], and after that visit was taken off of Coumadin but kept on Lovenox.  He has been taking his Lovenox 1x/day as instructed but ran out this evening.  He does not have a PCP and has not followed up with any other providers.  Pt has no other complaints at this time.  Patient has no insurance.   Past Medical History  Diagnosis Date  . H/O blood clots     Past Surgical History  Procedure Laterality Date  . Leg surgery      No family history on file.   History  Substance Use Topics  . Smoking status: Never Smoker   . Smokeless tobacco: Not on file  . Alcohol Use: Yes     Comment: social     Review of Systems  Constitutional: Negative for fever, diaphoresis, appetite change, fatigue and unexpected weight change.  HENT: Negative for mouth sores and neck stiffness.   Eyes: Negative for visual disturbance.  Respiratory: Negative for cough, chest tightness, shortness of breath and wheezing.   Cardiovascular: Negative for chest pain.  Gastrointestinal: Negative for nausea, vomiting, abdominal pain,  diarrhea and constipation.  Endocrine: Negative for polydipsia, polyphagia and polyuria.  Genitourinary: Negative for dysuria, urgency, frequency and hematuria.  Musculoskeletal: Negative for back pain.  Skin: Negative for rash.  Allergic/Immunologic: Negative for immunocompromised state.  Neurological: Negative for syncope, light-headedness and headaches.  Hematological: Does not bruise/bleed easily.  Psychiatric/Behavioral: Negative for sleep disturbance. The patient is not nervous/anxious.       Allergies  Amoxicillin and Darvocet  Home Medications   Current Outpatient Rx  Name  Route  Sig  Dispense  Refill  . enoxaparin (LOVENOX) 80 MG/0.8ML injection   Subcutaneous   Inject 80 mg into the skin every 12 (twelve) hours.         . fluticasone (FLONASE) 50 MCG/ACT nasal spray   Nasal   Place 2 sprays into the nose as needed for rhinitis.         Marland Kitchen HYDROcodone-acetaminophen (NORCO/VICODIN) 5-325 MG per tablet   Oral   Take 1 tablet by mouth every 6 (six) hours as needed for pain.   12 tablet   0   . nitroGLYCERIN (NITROSTAT) 0.4 MG SL tablet   Sublingual   Place 0.4 mg under the tongue every 5 (five) minutes as needed for chest pain.         . promethazine (PHENERGAN) 25 MG tablet   Oral   Take 1 tablet (25  mg total) by mouth every 6 (six) hours as needed for nausea.   12 tablet   0   . Rivaroxaban (XARELTO) 15 MG TABS tablet   Oral   Take 1 tablet (15 mg total) by mouth 2 (two) times daily.   42 tablet   0   . warfarin (COUMADIN) 10 MG tablet   Oral   Take 10 mg by mouth daily.          BP 105/65  Pulse 85  Temp(Src) 98.2 F (36.8 C) (Oral)  Resp 18  Ht 6' (1.829 m)  Wt 173 lb (78.472 kg)  BMI 23.46 kg/m2  SpO2 97%  Physical Exam  Nursing note and vitals reviewed. Constitutional: He is oriented to person, place, and time. He appears well-developed and well-nourished. No distress.  Awake, alert, nontoxic appearance  HENT:  Head:  Normocephalic and atraumatic.  Mouth/Throat: Oropharynx is clear and moist. No oropharyngeal exudate.  Eyes: Conjunctivae are normal. No scleral icterus.  Neck: Normal range of motion. Neck supple.  Cardiovascular: Normal rate, regular rhythm, normal heart sounds and intact distal pulses.   No murmur heard. Pulmonary/Chest: Effort normal and breath sounds normal. No respiratory distress. He has no wheezes. He has no rales.  Abdominal: Soft. Bowel sounds are normal. He exhibits no mass. There is no tenderness. There is no rebound and no guarding.  Musculoskeletal: Normal range of motion.  Neurological: He is alert and oriented to person, place, and time. He exhibits normal muscle tone. Coordination normal.  Speech is clear and goal oriented Moves extremities without ataxia  Skin: Skin is warm and dry. He is not diaphoretic.  Psychiatric: He has a normal mood and affect.    ED Course  Procedures (including critical care time)  DIAGNOSTIC STUDIES: Oxygen Saturation is 97% on room air, normal by my interpretation.    COORDINATION OF CARE: 10:22 PM-Discussed treatment plan which includes switching to Xarelto and [facility] with pt at bedside and pt agreed to plan.    Labs Review Labs Reviewed - No data to display  Imaging Review Dg Foot Complete Right  07/17/2013   *RADIOLOGY REPORT*  Clinical Data: Medial foot pain.  RIGHT FOOT COMPLETE - 3+ VIEW  Comparison: None.  Findings: Hallux valgus and mild first MTP DJD.  Small calcific density along the superior margin of the talonavicular joint on the lateral view.  Otherwise, no displaced acute fracture.  No dislocation. No aggressive osseous lesions.  IMPRESSION: Small calcific density projecting over the talonavicular joint on the lateral view is nonspecific.  Correlate for point tenderness for a small fracture fragment.   Original Report Authenticated By: Jearld Lesch, M.D.    MDM   1. Medication refill       Jesstin Connery  presents requesting refill for Lovenox prescription. Patient is no longer taking Coumadin as he was removed from this medication in July of 2014.  Discussed at length with patient and case management who reports that she will be unable to help cover the cost of his Lovenox he reports that he cannot afford it.  She is management can assist the price of Xarelto and patient is willing to make the switch.  Will Rx for Xarelto and pt has been given resources for the [employer].  Discussed the patient the need to followup assistant brought to prescription is only going to be good for 3 weeks.  I have also discussed reasons to return immediately to the ER.  Patient expresses understanding and agrees with plan.   I personally performed the services described in this documentation, which was scribed in my presence. The recorded information has been reviewed and is accurate.   Dahlia Client Tameron Lama, PA-C 07/18/13 0009

## 2013-07-17 NOTE — ED Notes (Signed)
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 R inner foot with swollen nodule noted with some bruising present.

## 2013-07-17 NOTE — ED Provider Notes (Signed)
Medical screening examination/treatment/procedure(s) were performed by non-physician practitioner and as supervising physician I was immediately available for consultation/collaboration.   Toshiyuki Fredell, MD 07/17/13 0637 

## 2013-07-17 NOTE — Progress Notes (Signed)
ED CM in to speak with patient in regards to medication assistance for Xarelto, and follow up care.  Pt states that he does not have health insurance, or a local PCP. Pt is unemployed. Provided verbal and written information about the [facility] and [facility] card. Provided and went over the [facility] discharge kit, and how to enroll. Explained that this trail offer is for a free 30 day supply of Xeralto, and he can fill prescription without a co- pay at any pharmacy. Pt verbalizes understanding and agrees with plan. Pt being discharged to self-care. Pt instructed to call to make appt. with [facility]. Pt  very appreciative. [employer] given No further CM needs identified.

## 2013-07-17 NOTE — ED Notes (Signed)
Pt refused crutches.

## 2013-07-17 NOTE — ED Notes (Signed)
PT. REQUESTING PRESCRIPTION REFILL FOR HIS LOVENOX , PT. STATED HE TOOK HIS LAST DOSE THIS EVENING .

## 2013-07-18 NOTE — ED Provider Notes (Signed)
Medical screening examination/treatment/procedure(s) were performed by non-physician practitioner and as supervising physician I was immediately available for consultation/collaboration.  Shon Baton, MD 07/18/13 [phone]

## 2013-07-30 ENCOUNTER — Ambulatory Visit: Payer: Self-pay

## 2013-08-13 ENCOUNTER — Ambulatory Visit: Payer: Self-pay | Attending: Internal Medicine | Admitting: Internal Medicine

## 2013-08-13 ENCOUNTER — Encounter: Payer: Self-pay | Admitting: Internal Medicine

## 2013-08-13 VITALS — BP 144/92 | HR 65 | Temp 97.5°F | Resp 16 | Ht 71.0 in | Wt 185.0 lb

## 2013-08-13 DIAGNOSIS — Z86718 Personal history of other venous thrombosis and embolism: Secondary | ICD-10-CM | POA: Insufficient documentation

## 2013-08-13 DIAGNOSIS — I82401 Acute embolism and thrombosis of unspecified deep veins of right lower extremity: Secondary | ICD-10-CM

## 2013-08-13 DIAGNOSIS — I2699 Other pulmonary embolism without acute cor pulmonale: Secondary | ICD-10-CM

## 2013-08-13 DIAGNOSIS — G894 Chronic pain syndrome: Secondary | ICD-10-CM

## 2013-08-13 DIAGNOSIS — Z7901 Long term (current) use of anticoagulants: Secondary | ICD-10-CM | POA: Insufficient documentation

## 2013-08-13 DIAGNOSIS — Z79899 Other long term (current) drug therapy: Secondary | ICD-10-CM | POA: Insufficient documentation

## 2013-08-13 DIAGNOSIS — I82409 Acute embolism and thrombosis of unspecified deep veins of unspecified lower extremity: Secondary | ICD-10-CM

## 2013-08-13 DIAGNOSIS — M79609 Pain in unspecified limb: Secondary | ICD-10-CM | POA: Insufficient documentation

## 2013-08-13 MED ORDER — RIVAROXABAN 20 MG PO TABS: 20.0000 mg | ORAL_TABLET | Freq: Every day | ORAL | Status: DC

## 2013-08-13 MED ORDER — HYDROCODONE-ACETAMINOPHEN 5-325 MG PO TABS: 1.0000 | ORAL_TABLET | Freq: Four times a day (QID) | ORAL | Status: DC | PRN
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
## 2013-08-29 ENCOUNTER — Ambulatory Visit: Payer: Self-pay

## 2013-09-20 ENCOUNTER — Telehealth: Payer: Self-pay

## 2013-09-20 NOTE — Progress Notes (Signed)
Quick Note:  Please ask patient to follow up in 1-2 weeks ______

## 2013-09-20 NOTE — Telephone Encounter (Signed)
Both numbers on file are either disconnected or temporarily not working Can not leave message

## 2013-09-20 NOTE — Telephone Encounter (Signed)
Message copied by Lestine Mount on Fri Sep 20, 2013  4:14 PM ------      Message from: Maretta Bees      Created: Fri Sep 20, 2013  4:04 PM       Please ask patient to follow up in 1-2 weeks ------

## 2014-06-24 ENCOUNTER — Encounter (HOSPITAL_COMMUNITY): Payer: Self-pay | Admitting: Emergency Medicine

## 2014-06-24 ENCOUNTER — Emergency Department (HOSPITAL_COMMUNITY)
Admission: EM | Admit: 2014-06-24 | Discharge: 2014-06-25 | Disposition: A | Discharge status: Home / Self Care | Payer: Self-pay | Attending: Emergency Medicine | Admitting: Emergency Medicine

## 2014-06-24 ENCOUNTER — Emergency Department (HOSPITAL_COMMUNITY): Payer: Self-pay

## 2014-06-24 DIAGNOSIS — Z87828 Personal history of other (healed) physical injury and trauma: Secondary | ICD-10-CM | POA: Insufficient documentation

## 2014-06-24 DIAGNOSIS — W1809XA Striking against other object with subsequent fall, initial encounter: Secondary | ICD-10-CM | POA: Insufficient documentation

## 2014-06-24 DIAGNOSIS — S298XXA Other specified injuries of thorax, initial encounter: Secondary | ICD-10-CM | POA: Insufficient documentation

## 2014-06-24 DIAGNOSIS — Y9389 Activity, other specified: Secondary | ICD-10-CM | POA: Insufficient documentation

## 2014-06-24 DIAGNOSIS — R319 Hematuria, unspecified: Secondary | ICD-10-CM | POA: Insufficient documentation

## 2014-06-24 DIAGNOSIS — N390 Urinary tract infection, site not specified: Secondary | ICD-10-CM | POA: Insufficient documentation

## 2014-06-24 DIAGNOSIS — T148XXA Other injury of unspecified body region, initial encounter: Secondary | ICD-10-CM | POA: Insufficient documentation

## 2014-06-24 DIAGNOSIS — S3981XA Other specified injuries of abdomen, initial encounter: Secondary | ICD-10-CM | POA: Insufficient documentation

## 2014-06-24 DIAGNOSIS — Z7901 Long term (current) use of anticoagulants: Secondary | ICD-10-CM | POA: Insufficient documentation

## 2014-06-24 DIAGNOSIS — Y9289 Other specified places as the place of occurrence of the external cause: Secondary | ICD-10-CM | POA: Insufficient documentation

## 2014-06-24 DIAGNOSIS — Z880 Allergy status to penicillin: Secondary | ICD-10-CM | POA: Insufficient documentation

## 2014-06-24 DIAGNOSIS — Z86718 Personal history of other venous thrombosis and embolism: Secondary | ICD-10-CM | POA: Insufficient documentation

## 2014-06-24 LAB — URINALYSIS, ROUTINE W REFLEX MICROSCOPIC
Bilirubin Urine: NEGATIVE
Glucose, UA: NEGATIVE mg/dL
Hgb urine dipstick: NEGATIVE
KETONES UR: NEGATIVE mg/dL
LEUKOCYTES UA: NEGATIVE
NITRITE: NEGATIVE
PROTEIN: 30 mg/dL — AB
Specific Gravity, Urine: 1.007 (ref 1.005–1.030)
UROBILINOGEN UA: 0.2 mg/dL (ref 0.0–1.0)
pH: 7 (ref 5.0–8.0)

## 2014-06-24 LAB — CBC
HCT: 41 % (ref 39.0–52.0)
Hemoglobin: 14 g/dL (ref 13.0–17.0)
MCH: 33.1 pg (ref 26.0–34.0)
MCHC: 34.1 g/dL (ref 30.0–36.0)
MCV: 96.9 fL (ref 78.0–100.0)
PLATELETS: 144 10*3/uL — AB (ref 150–400)
RBC: 4.23 MIL/uL (ref 4.22–5.81)
RDW: 13.6 % (ref 11.5–15.5)
WBC: 4.7 10*3/uL (ref 4.0–10.5)

## 2014-06-24 LAB — I-STAT CHEM 8, ED
BUN: 5 mg/dL — AB (ref 6–23)
CALCIUM ION: 1.13 mmol/L (ref 1.12–1.23)
CREATININE: 1 mg/dL (ref 0.50–1.35)
Chloride: 97 mEq/L (ref 96–112)
Glucose, Bld: 94 mg/dL (ref 70–99)
HCT: 42 % (ref 39.0–52.0)
Hemoglobin: 14.3 g/dL (ref 13.0–17.0)
Potassium: 3.4 mEq/L — ABNORMAL LOW (ref 3.7–5.3)
SODIUM: 138 meq/L (ref 137–147)
TCO2: 28 mmol/L (ref 0–100)

## 2014-06-24 LAB — URINE MICROSCOPIC-ADD ON

## 2014-06-24 MED ORDER — MORPHINE SULFATE 4 MG/ML IJ SOLN
2.0000 mg | Freq: Once | INTRAMUSCULAR | Status: AC
  Administered 2014-06-24: 2 mg via INTRAVENOUS
  Filled 2014-06-24: qty 1

## 2014-06-24 NOTE — ED Notes (Signed)
Pt states that he fell on Friday. He started having pain in his left chest Sunday and started noticing blood in urine yesterday. Pt states painful left leg as well.
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
## 2014-06-25 ENCOUNTER — Encounter (HOSPITAL_COMMUNITY): Payer: Self-pay | Admitting: Radiology

## 2014-06-25 ENCOUNTER — Emergency Department (HOSPITAL_COMMUNITY): Payer: PRIVATE HEALTH INSURANCE

## 2014-06-25 MED ORDER — OXYCODONE-ACETAMINOPHEN 5-325 MG PO TABS
2.0000 | ORAL_TABLET | Freq: Once | ORAL | Status: AC
  Administered 2014-06-25: 2 via ORAL
  Filled 2014-06-25: qty 2

## 2014-06-25 MED ORDER — IOHEXOL 300 MG/ML  SOLN
80.0000 mL | Freq: Once | INTRAMUSCULAR | Status: AC | PRN
  Administered 2014-06-25: 80 mL via INTRAVENOUS

## 2014-06-25 MED ORDER — XARELTO VTE STARTER PACK 15 & 20 MG PO TBPK: 15.0000 mg | ORAL_TABLET | ORAL | Status: DC

## 2014-06-25 MED ORDER — NAPROXEN 500 MG PO TABS: 500.0000 mg | ORAL_TABLET | Freq: Two times a day (BID) | ORAL | Status: DC

## 2014-06-25 MED ORDER — SULFAMETHOXAZOLE-TRIMETHOPRIM 800-160 MG PO TABS: 1.0000 | ORAL_TABLET | Freq: Two times a day (BID) | ORAL | Status: DC

## 2014-06-25 MED ORDER — OXYCODONE-ACETAMINOPHEN 5-325 MG PO TABS: 1.0000 | ORAL_TABLET | ORAL | Status: DC | PRN

## 2014-06-25 NOTE — Discharge Instructions (Signed)
Your testing shows a small swollen area on the left rib - this should be followed up at your orthopedic doctors office in the next month if not improving - , take medicines for 10 days for infection of your urine and start xarelto as a blood thinner for your history of blood clots.  You MUST follow up in next 2 days with your family doctor   Please call your doctor for a followup appointment within 24-48 hours. When you talk to your doctor please let them know that you were seen in the emergency department and have them acquire all of your records so that they can discuss the findings with you and formulate a treatment plan to fully care for your new and ongoing problems.

## 2014-06-25 NOTE — ED Notes (Signed)
Discharge instructions reviewed with pt. Pt verbalized understanding.   

## 2014-06-26 LAB — URINE CULTURE
Colony Count: NO GROWTH
Culture: NO GROWTH
Special Requests: NORMAL

## 2014-12-28 ENCOUNTER — Emergency Department (HOSPITAL_COMMUNITY)
Admission: EM | Admit: 2014-12-28 | Discharge: 2014-12-28 | Disposition: A | Discharge status: Home / Self Care | Payer: PRIVATE HEALTH INSURANCE | Attending: Emergency Medicine | Admitting: Emergency Medicine

## 2014-12-28 ENCOUNTER — Encounter (HOSPITAL_COMMUNITY): Payer: Self-pay | Admitting: Cardiology

## 2014-12-28 DIAGNOSIS — Z791 Long term (current) use of non-steroidal anti-inflammatories (NSAID): Secondary | ICD-10-CM | POA: Insufficient documentation

## 2014-12-28 DIAGNOSIS — Z7901 Long term (current) use of anticoagulants: Secondary | ICD-10-CM | POA: Insufficient documentation

## 2014-12-28 DIAGNOSIS — G8929 Other chronic pain: Secondary | ICD-10-CM | POA: Diagnosis not present

## 2014-12-28 DIAGNOSIS — M79604 Pain in right leg: Secondary | ICD-10-CM | POA: Diagnosis present

## 2014-12-28 DIAGNOSIS — Z86718 Personal history of other venous thrombosis and embolism: Secondary | ICD-10-CM

## 2014-12-28 DIAGNOSIS — I824Z1 Acute embolism and thrombosis of unspecified deep veins of right distal lower extremity: Secondary | ICD-10-CM | POA: Insufficient documentation

## 2014-12-28 DIAGNOSIS — Z880 Allergy status to penicillin: Secondary | ICD-10-CM | POA: Diagnosis not present

## 2014-12-28 DIAGNOSIS — I82401 Acute embolism and thrombosis of unspecified deep veins of right lower extremity: Secondary | ICD-10-CM

## 2014-12-28 LAB — PROTIME-INR
INR: 0.97 (ref 0.00–1.49)
PROTHROMBIN TIME: 13 s (ref 11.6–15.2)

## 2014-12-28 MED ORDER — ENOXAPARIN SODIUM 150 MG/ML ~~LOC~~ SOLN
1.0000 mg/kg | Freq: Once | SUBCUTANEOUS | Status: AC
  Administered 2014-12-28: 80 mg via SUBCUTANEOUS
  Filled 2014-12-28: qty 1

## 2014-12-28 MED ORDER — TRAMADOL HCL 50 MG PO TABS
50.0000 mg | ORAL_TABLET | Freq: Once | ORAL | Status: AC
  Administered 2014-12-28: 50 mg via ORAL
  Filled 2014-12-28: qty 1

## 2014-12-28 MED ORDER — WARFARIN SODIUM 7.5 MG PO TABS: 7.5000 mg | ORAL_TABLET | Freq: Every day | ORAL | Status: DC

## 2014-12-28 MED ORDER — OXYCODONE-ACETAMINOPHEN 5-325 MG PO TABS
1.0000 | ORAL_TABLET | Freq: Once | ORAL | Status: AC
  Administered 2014-12-28: 1 via ORAL
  Filled 2014-12-28: qty 1

## 2014-12-28 MED ORDER — DIPHENHYDRAMINE HCL 25 MG PO CAPS
25.0000 mg | ORAL_CAPSULE | Freq: Once | ORAL | Status: AC
  Administered 2014-12-28: 25 mg via ORAL
  Filled 2014-12-28: qty 1

## 2014-12-28 MED ORDER — OXYCODONE-ACETAMINOPHEN 5-325 MG PO TABS: 1.0000 | ORAL_TABLET | Freq: Four times a day (QID) | ORAL | Status: DC | PRN

## 2014-12-28 MED ORDER — ENOXAPARIN SODIUM 100 MG/ML ~~LOC~~ SOLN: 1.0000 mg/kg | Freq: Two times a day (BID) | SUBCUTANEOUS | Status: AC
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
## 2014-12-28 NOTE — ED Notes (Signed)
Pt reports that he has a DVT in the right leg. States he takes Lovenox and coumadin for this. Pt reports pain to the leg and swelling.

## 2014-12-28 NOTE — Discharge Instructions (Signed)
It was our pleasure to provide your ER care today - we hope that you feel better.  Take lovenox as prescribed for the next week.  Also take coumadin as prescribed.  Keep leg elevated as much as possible to help with the swelling and/or pain.  You may take percocet as need for pain. No driving for the next 6 hours or when taking percocet. Also, do not take tylenol or acetaminophen containing medication when taking percocet.   You must follow up with coumadin clinic this week - see referral - call Monday to arrange appointment.  Also follow up with primary care doctor in coming week - see referral - call Monday for appointment.  Return to ER right away if worse, severe pain, trouble breathing, chest pain, other concern.  You were given pain medication in the ER - no driving for the next 6 hours.        Deep Vein Thrombosis A deep vein thrombosis (DVT) is a blood clot that develops in the deep, larger veins of the leg, arm, or pelvis. These are more dangerous than clots that might form in veins near the surface of the body. A DVT can lead to serious and even life-threatening complications if the clot breaks off and travels in the bloodstream to the lungs.  A DVT can damage the valves in your leg veins so that instead of flowing upward, the blood pools in the lower leg. This is called post-thrombotic syndrome, and it can result in pain, swelling, discoloration, and sores on the leg. CAUSES Usually, several things contribute to the formation of blood clots. Contributing factors include:  The flow of blood slows down.  The inside of the vein is damaged in some way.  You have a condition that makes blood clot more easily. RISK FACTORS Some people are more likely than others to develop blood clots. Risk factors include:   Smoking.  Being overweight (obese).  Sitting or lying still for a long time. This includes long-distance travel, paralysis, or recovery from an illness or  surgery. Other factors that increase risk are:   Older age, especially over 375 years of age.  Having a family history of blood clots or if you have already had a blot clot.  Having major or lengthy surgery. This is especially true for surgery on the hip, knee, or belly (abdomen). Hip surgery is particularly high risk.  Having a long, thin tube (catheter) placed inside a vein during a medical procedure.  Breaking a hip or leg.  Having cancer or cancer treatment.  Pregnancy and childbirth.  Hormone changes make the blood clot more easily during pregnancy.  The fetus puts pressure on the veins of the pelvis.  There is a risk of injury to veins during delivery or a caesarean delivery. The risk is highest just after childbirth.  Medicines containing the male hormone estrogen. This includes birth control pills and hormone replacement therapy.  Other circulation or heart problems.  SIGNS AND SYMPTOMS When a clot forms, it can either partially or totally block the blood flow in that vein. Symptoms of a DVT can include:  Swelling of the leg or arm, especially if one side is much worse.  Warmth and redness of the leg or arm, especially if one side is much worse.  Pain in an arm or leg. If the clot is in the leg, symptoms may be more noticeable or worse when standing or walking. The symptoms of a DVT that has traveled to the  lungs (pulmonary embolism, PE) usually start suddenly and include:  Shortness of breath.  Coughing.  Coughing up blood or blood-tinged mucus.  Chest pain. The chest pain is often worse with deep breaths.  Rapid heartbeat. Anyone with these symptoms should get emergency medical treatment right away. Do not wait to see if the symptoms will go away. Call your local emergency services (911 in the [place] if you have these symptoms. Do not drive yourself to the hospital. DIAGNOSIS If a DVT is suspected, your health care provider will take a full medical history  and perform a physical exam. Tests that also may be required include:  Blood tests, including studies of the clotting properties of the blood.  Ultrasound to see if you have clots in your legs or lungs.  X-rays to show the flow of blood when dye is injected into the veins (venogram).  Studies of your lungs if you have any chest symptoms. PREVENTION  Exercise the legs regularly. Take a brisk 30-minute walk every day.  Maintain a weight that is appropriate for your height.  Avoid sitting or lying in bed for long periods of time without moving your legs.  Women, particularly those over the age of 35 years, should consider the risks and benefits of taking estrogen medicines, including birth control pills.  Do not smoke, especially if you take estrogen medicines.  Long-distance travel can increase your risk of DVT. You should exercise your legs by walking or pumping the muscles every hour.  Many of the risk factors above relate to situations that exist with hospitalization, either for illness, injury, or elective surgery. Prevention may include medical and nonmedical measures.  Your health care provider will assess you for the need for venous thromboembolism prevention when you are admitted to the hospital. If you are having surgery, your surgeon will assess you the day of or day after surgery. TREATMENT Once identified, a DVT can be treated. It can also be prevented in some circumstances. Once you have had a DVT, you may be at increased risk for a DVT in the future. The most common treatment for DVT is blood-thinning (anticoagulant) medicine, which reduces the blood's tendency to clot. Anticoagulants can stop new blood clots from forming and stop old clots from growing. They cannot dissolve existing clots. Your body does this by itself over time. Anticoagulants can be given by mouth, through an IV tube, or by injection. Your health care provider will determine the best program for you. Other  medicines or treatments that may be used are:  Heparin or related medicines (low molecular weight heparin) are often the first treatment for a blood clot. They act quickly. However, they cannot be taken orally and must be given either in shot form or by IV tube.  Heparin can cause a fall in a component of blood that stops bleeding and forms blood clots (platelets). You will be monitored with blood tests to be sure this does not occur.  Warfarin is an anticoagulant that can be swallowed. It takes a few days to start working, so usually heparin or related medicines are used in combination. Once warfarin is working, heparin is usually stopped.  Factor Xa inhibitor medicines, such as rivaroxaban and apixaban, also reduce blood clotting. These medicines are taken orally and can often be used without heparin or related medicines.  Less commonly, clot dissolving drugs (thrombolytics) are used to dissolve a DVT. They carry a high risk of bleeding, so they are used mainly in severe cases where  your life or a part of your body is threatened.  Very rarely, a blood clot in the leg needs to be removed surgically.  If you are unable to take anticoagulants, your health care provider may arrange for you to have a filter placed in a main vein in your abdomen. This filter prevents clots from traveling to your lungs. HOME CARE INSTRUCTIONS  Take all medicines as directed by your health care provider.  Learn as much as you can about DVT.  Wear a medical alert bracelet or carry a medical alert card.  Ask your health care provider how soon you can go back to normal activities. It is important to stay active to prevent blood clots. If you are on anticoagulant medicine, avoid contact sports.  It is very important to exercise. This is especially important while traveling, sitting, or standing for long periods of time. Exercise your legs by walking or by tightening and relaxing your leg muscles regularly. Take  frequent walks.  You may need to wear compression stockings. These are tight elastic stockings that apply pressure to the lower legs. This pressure can help keep the blood in the legs from clotting. Taking Warfarin Warfarin is a daily medicine that is taken by mouth. Your health care provider will advise you on the length of treatment (usually 3-6 months, sometimes lifelong). If you take warfarin:  Understand how to take warfarin and foods that can affect how warfarin works in [employer] relations account [employer].  Too much and too little warfarin are both dangerous. Too much warfarin increases the risk of bleeding. Too little warfarin continues to allow the risk for blood clots. Warfarin and Regular Blood Testing While taking warfarin, you will need to have regular blood tests to measure your blood clotting time. These blood tests usually include both the prothrombin time (PT) and international normalized ratio (INR) tests. The PT and INR results allow your health care provider to adjust your dose of warfarin. It is very important that you have your PT and INR tested as often as directed by your health care provider.  Warfarin and Your Diet Avoid major changes in your diet, or notify your health care provider before changing your diet. Arrange a visit with a registered dietitian to answer your questions. Many foods, especially foods high in vitamin K, can interfere with warfarin and affect the PT and INR results. You should eat a consistent amount of foods high in vitamin K. Foods high in vitamin K include:   Spinach, kale, broccoli, cabbage, collard and turnip greens, Brussels sprouts, peas, cauliflower, seaweed, and parsley.  Beef and pork liver.  Green tea.  Soybean oil. Warfarin with Other Medicines Many medicines can interfere with warfarin and affect the PT and INR results. You must:  Tell your health care provider about any and all medicines, vitamins, and supplements you take, including aspirin and other  over-the-counter anti-inflammatory medicines. Be especially cautious with aspirin and anti-inflammatory medicines. Ask your health care provider before taking these.  Do not take or discontinue any prescribed or over-the-counter medicine except on the advice of your health care provider or pharmacist. Warfarin Side Effects Warfarin can have side effects, such as easy bruising and difficulty stopping bleeding. Ask your health care provider or pharmacist about other side effects of warfarin. You will need to:  Hold pressure over cuts for longer than usual.  Notify your dentist and other health care providers that you are taking warfarin before you undergo any procedures where bleeding may occur. Warfarin with  Alcohol and Tobacco   Drinking alcohol frequently can increase the effect of warfarin, leading to excess bleeding. It is best to avoid alcoholic drinks or to consume only very small amounts while taking warfarin. Notify your health care provider if you change your alcohol intake.   Do not use any tobacco products including cigarettes, chewing tobacco, or electronic cigarettes. If you smoke, quit. Ask your health care provider for help with quitting smoking. Alternative Medicines to Warfarin: Factor Xa Inhibitor Medicines  These blood-thinning medicines are taken by mouth, usually for several weeks or longer. It is important to take the medicine every single day at the same time each day.  There are no regular blood tests required when using these medicines.  There are fewer food and drug interactions than with warfarin.  The side effects of this class of medicine are similar to those of warfarin, including excessive bruising or bleeding. Ask your health care provider or pharmacist about other potential side effects. SEEK MEDICAL CARE IF:  You notice a rapid heartbeat.  You feel weaker or more tired than usual.  You feel faint.  You notice increased bruising.  You feel your  symptoms are not getting better in the time expected.  You believe you are having side effects of medicine. SEEK IMMEDIATE MEDICAL CARE IF:  You have chest pain.  You have trouble breathing.  You have new or increased swelling or pain in one leg.  You cough up blood.  You notice blood in vomit, in a bowel movement, or in urine. MAKE SURE YOU:  Understand these instructions.  Will watch your condition.  Will get help right away if you are not doing well or get worse. Document Released: 11/07/2005 Document Revised: 03/24/2014 Document Reviewed: 07/15/2013 [facility] Patient Information 2015 [place], [place]. This information is not intended to replace advice given to you by your health care provider. Make sure you discuss any questions you have with your health care provider.  Deep Vein Thrombosis A deep vein thrombosis (DVT) is a blood clot that develops in the deep, larger veins of the leg, arm, or pelvis. These are more dangerous than clots that might form in veins near the surface of the body. A DVT can lead to serious and even life-threatening complications if the clot breaks off and travels in the bloodstream to the lungs.  A DVT can damage the valves in your leg veins so that instead of flowing upward, the blood pools in the lower leg. This is called post-thrombotic syndrome, and it can result in pain, swelling, discoloration, and sores on the leg. CAUSES Usually, several things contribute to the formation of blood clots. Contributing factors include:  The flow of blood slows down.  The inside of the vein is damaged in some way.  You have a condition that makes blood clot more easily. RISK FACTORS Some people are more likely than others to develop blood clots. Risk factors include:   Smoking.  Being overweight (obese).  Sitting or lying still for a long time. This includes long-distance travel, paralysis, or recovery from an illness or surgery. Other factors that increase  risk are:   Older age, especially over 77 years of age.  Having a family history of blood clots or if you have already had a blot clot.  Having major or lengthy surgery. This is especially true for surgery on the hip, knee, or belly (abdomen). Hip surgery is particularly high risk.  Having a long, thin tube (catheter) placed inside a  vein during a medical procedure.  Breaking a hip or leg.  Having cancer or cancer treatment.  Pregnancy and childbirth.  Hormone changes make the blood clot more easily during pregnancy.  The fetus puts pressure on the veins of the pelvis.  There is a risk of injury to veins during delivery or a caesarean delivery. The risk is highest just after childbirth.  Medicines containing the male hormone estrogen. This includes birth control pills and hormone replacement therapy.  Other circulation or heart problems.  SIGNS AND SYMPTOMS When a clot forms, it can either partially or totally block the blood flow in that vein. Symptoms of a DVT can include:  Swelling of the leg or arm, especially if one side is much worse.  Warmth and redness of the leg or arm, especially if one side is much worse.  Pain in an arm or leg. If the clot is in the leg, symptoms may be more noticeable or worse when standing or walking. The symptoms of a DVT that has traveled to the lungs (pulmonary embolism, PE) usually start suddenly and include:  Shortness of breath.  Coughing.  Coughing up blood or blood-tinged mucus.  Chest pain. The chest pain is often worse with deep breaths.  Rapid heartbeat. Anyone with these symptoms should get emergency medical treatment right away. Do not wait to see if the symptoms will go away. Call your local emergency services (911 in the [place] if you have these symptoms. Do not drive yourself to the hospital. DIAGNOSIS If a DVT is suspected, your health care provider will take a full medical history and perform a physical exam. Tests that  also may be required include:  Blood tests, including studies of the clotting properties of the blood.  Ultrasound to see if you have clots in your legs or lungs.  X-rays to show the flow of blood when dye is injected into the veins (venogram).  Studies of your lungs if you have any chest symptoms. PREVENTION  Exercise the legs regularly. Take a brisk 30-minute walk every day.  Maintain a weight that is appropriate for your height.  Avoid sitting or lying in bed for long periods of time without moving your legs.  Women, particularly those over the age of 35 years, should consider the risks and benefits of taking estrogen medicines, including birth control pills.  Do not smoke, especially if you take estrogen medicines.  Long-distance travel can increase your risk of DVT. You should exercise your legs by walking or pumping the muscles every hour.  Many of the risk factors above relate to situations that exist with hospitalization, either for illness, injury, or elective surgery. Prevention may include medical and nonmedical measures.  Your health care provider will assess you for the need for venous thromboembolism prevention when you are admitted to the hospital. If you are having surgery, your surgeon will assess you the day of or day after surgery. TREATMENT Once identified, a DVT can be treated. It can also be prevented in some circumstances. Once you have had a DVT, you may be at increased risk for a DVT in the future. The most common treatment for DVT is blood-thinning (anticoagulant) medicine, which reduces the blood's tendency to clot. Anticoagulants can stop new blood clots from forming and stop old clots from growing. They cannot dissolve existing clots. Your body does this by itself over time. Anticoagulants can be given by mouth, through an IV tube, or by injection. Your health care provider will determine the  best program for you. Other medicines or treatments that may be used  are:  Heparin or related medicines (low molecular weight heparin) are often the first treatment for a blood clot. They act quickly. However, they cannot be taken orally and must be given either in shot form or by IV tube.  Heparin can cause a fall in a component of blood that stops bleeding and forms blood clots (platelets). You will be monitored with blood tests to be sure this does not occur.  Warfarin is an anticoagulant that can be swallowed. It takes a few days to start working, so usually heparin or related medicines are used in combination. Once warfarin is working, heparin is usually stopped.  Factor Xa inhibitor medicines, such as rivaroxaban and apixaban, also reduce blood clotting. These medicines are taken orally and can often be used without heparin or related medicines.  Less commonly, clot dissolving drugs (thrombolytics) are used to dissolve a DVT. They carry a high risk of bleeding, so they are used mainly in severe cases where your life or a part of your body is threatened.  Very rarely, a blood clot in the leg needs to be removed surgically.  If you are unable to take anticoagulants, your health care provider may arrange for you to have a filter placed in a main vein in your abdomen. This filter prevents clots from traveling to your lungs. HOME CARE INSTRUCTIONS  Take all medicines as directed by your health care provider.  Learn as much as you can about DVT.  Wear a medical alert bracelet or carry a medical alert card.  Ask your health care provider how soon you can go back to normal activities. It is important to stay active to prevent blood clots. If you are on anticoagulant medicine, avoid contact sports.  It is very important to exercise. This is especially important while traveling, sitting, or standing for long periods of time. Exercise your legs by walking or by tightening and relaxing your leg muscles regularly. Take frequent walks.  You may need to wear  compression stockings. These are tight elastic stockings that apply pressure to the lower legs. This pressure can help keep the blood in the legs from clotting. Taking Warfarin Warfarin is a daily medicine that is taken by mouth. Your health care provider will advise you on the length of treatment (usually 3-6 months, sometimes lifelong). If you take warfarin:  Understand how to take warfarin and foods that can affect how warfarin works in [employer] relations account executive.  Too much and too little warfarin are both dangerous. Too much warfarin increases the risk of bleeding. Too little warfarin continues to allow the risk for blood clots. Warfarin and Regular Blood Testing While taking warfarin, you will need to have regular blood tests to measure your blood clotting time. These blood tests usually include both the prothrombin time (PT) and international normalized ratio (INR) tests. The PT and INR results allow your health care provider to adjust your dose of warfarin. It is very important that you have your PT and INR tested as often as directed by your health care provider.  Warfarin and Your Diet Avoid major changes in your diet, or notify your health care provider before changing your diet. Arrange a visit with a registered dietitian to answer your questions. Many foods, especially foods high in vitamin K, can interfere with warfarin and affect the PT and INR results. You should eat a consistent amount of foods high in vitamin K. Foods high in  vitamin K include:   Spinach, kale, broccoli, cabbage, collard and turnip greens, Brussels sprouts, peas, cauliflower, seaweed, and parsley.  Beef and pork liver.  Green tea.  Soybean oil. Warfarin with Other Medicines Many medicines can interfere with warfarin and affect the PT and INR results. You must:  Tell your health care provider about any and all medicines, vitamins, and supplements you take, including aspirin and other over-the-counter anti-inflammatory  medicines. Be especially cautious with aspirin and anti-inflammatory medicines. Ask your health care provider before taking these.  Do not take or discontinue any prescribed or over-the-counter medicine except on the advice of your health care provider or pharmacist. Warfarin Side Effects Warfarin can have side effects, such as easy bruising and difficulty stopping bleeding. Ask your health care provider or pharmacist about other side effects of warfarin. You will need to:  Hold pressure over cuts for longer than usual.  Notify your dentist and other health care providers that you are taking warfarin before you undergo any procedures where bleeding may occur. Warfarin with Alcohol and Tobacco   Drinking alcohol frequently can increase the effect of warfarin, leading to excess bleeding. It is best to avoid alcoholic drinks or to consume only very small amounts while taking warfarin. Notify your health care provider if you change your alcohol intake.   Do not use any tobacco products including cigarettes, chewing tobacco, or electronic cigarettes. If you smoke, quit. Ask your health care provider for help with quitting smoking. Alternative Medicines to Warfarin: Factor Xa Inhibitor Medicines  These blood-thinning medicines are taken by mouth, usually for several weeks or longer. It is important to take the medicine every single day at the same time each day.  There are no regular blood tests required when using these medicines.  There are fewer food and drug interactions than with warfarin.  The side effects of this class of medicine are similar to those of warfarin, including excessive bruising or bleeding. Ask your health care provider or pharmacist about other potential side effects. SEEK MEDICAL CARE IF:  You notice a rapid heartbeat.  You feel weaker or more tired than usual.  You feel faint.  You notice increased bruising.  You feel your symptoms are not getting better in the  time expected.  You believe you are having side effects of medicine. SEEK IMMEDIATE MEDICAL CARE IF:  You have chest pain.  You have trouble breathing.  You have new or increased swelling or pain in one leg.  You cough up blood.  You notice blood in vomit, in a bowel movement, or in urine. MAKE SURE YOU:  Understand these instructions.  Will watch your condition.  Will get help right away if you are not doing well or get worse. Document Released: 11/07/2005 Document Revised: 03/24/2014 Document Reviewed: 07/15/2013 [facility] Patient Information 2015 [place], [place]. This information is not intended to replace advice given to you by your health care provider. Make sure you discuss any questions you have with your health care provider.

## 2014-12-28 NOTE — Progress Notes (Signed)
VASCULAR LAB PRELIMINARY  PRELIMINARY  PRELIMINARY  PRELIMINARY  Bilateral lower extremity venous Dopplers completed.    Preliminary report:  There is acute DVT noted in the right distal femoral vein.  All other veins appear thrombus free.   Silver Achey, RVT 12/28/2014, 3:46 PM

## 2015-01-25 ENCOUNTER — Emergency Department (HOSPITAL_COMMUNITY): Payer: PRIVATE HEALTH INSURANCE

## 2015-01-25 ENCOUNTER — Emergency Department (HOSPITAL_COMMUNITY)
Admission: EM | Admit: 2015-01-25 | Discharge: 2015-01-26 | Disposition: A | Discharge status: Home / Self Care | Payer: Self-pay | Attending: Emergency Medicine | Admitting: Emergency Medicine

## 2015-01-25 ENCOUNTER — Encounter (HOSPITAL_COMMUNITY): Payer: Self-pay | Admitting: Emergency Medicine

## 2015-01-25 DIAGNOSIS — Z5181 Encounter for therapeutic drug level monitoring: Secondary | ICD-10-CM

## 2015-01-25 DIAGNOSIS — E860 Dehydration: Secondary | ICD-10-CM

## 2015-01-25 DIAGNOSIS — I82401 Acute embolism and thrombosis of unspecified deep veins of right lower extremity: Secondary | ICD-10-CM

## 2015-01-25 DIAGNOSIS — R4020 Unspecified coma: Secondary | ICD-10-CM

## 2015-01-25 DIAGNOSIS — Z7982 Long term (current) use of aspirin: Secondary | ICD-10-CM | POA: Insufficient documentation

## 2015-01-25 DIAGNOSIS — Z880 Allergy status to penicillin: Secondary | ICD-10-CM | POA: Insufficient documentation

## 2015-01-25 DIAGNOSIS — Z8673 Personal history of transient ischemic attack (TIA), and cerebral infarction without residual deficits: Secondary | ICD-10-CM | POA: Insufficient documentation

## 2015-01-25 DIAGNOSIS — R319 Hematuria, unspecified: Secondary | ICD-10-CM | POA: Insufficient documentation

## 2015-01-25 DIAGNOSIS — R791 Abnormal coagulation profile: Secondary | ICD-10-CM | POA: Insufficient documentation

## 2015-01-25 DIAGNOSIS — Z7901 Long term (current) use of anticoagulants: Secondary | ICD-10-CM

## 2015-01-25 DIAGNOSIS — R55 Syncope and collapse: Secondary | ICD-10-CM | POA: Insufficient documentation

## 2015-01-25 HISTORY — DX: Cerebral infarction, unspecified: I63.9

## 2015-01-25 LAB — BASIC METABOLIC PANEL
ANION GAP: 4 — AB (ref 5–15)
BUN: 14 mg/dL (ref 6–23)
CO2: 28 mmol/L (ref 19–32)
Calcium: 8.9 mg/dL (ref 8.4–10.5)
Chloride: 105 mmol/L (ref 96–112)
Creatinine, Ser: 0.96 mg/dL (ref 0.50–1.35)
GFR calc Af Amer: >90 mL/min (ref >90)
GFR calc non Af Amer: >90 mL/min (ref >90)
GLUCOSE: 80 mg/dL (ref 70–99)
POTASSIUM: 3.5 mmol/L (ref 3.5–5.1)
Sodium: 137 mmol/L (ref 135–145)

## 2015-01-25 LAB — CBC
HEMATOCRIT: 38.9 % — AB (ref 39.0–52.0)
Hemoglobin: 13.1 g/dL (ref 13.0–17.0)
MCH: 32.3 pg (ref 26.0–34.0)
MCHC: 33.7 g/dL (ref 30.0–36.0)
MCV: 96 fL (ref 78.0–100.0)
Platelets: 271 10*3/uL (ref 150–400)
RBC: 4.05 MIL/uL — ABNORMAL LOW (ref 4.22–5.81)
RDW: 14.7 % (ref 11.5–15.5)
WBC: 6.5 10*3/uL (ref 4.0–10.5)

## 2015-01-25 LAB — PROTIME-INR
INR: 0.97 (ref 0.00–1.49)
PROTHROMBIN TIME: 13 s (ref 11.6–15.2)

## 2015-01-25 LAB — I-STAT TROPONIN, ED: TROPONIN I, POC: 0 ng/mL (ref 0.00–0.08)

## 2015-01-25 MED ORDER — OXYCODONE-ACETAMINOPHEN 5-325 MG PO TABS
2.0000 | ORAL_TABLET | Freq: Once | ORAL | Status: AC
  Administered 2015-01-25: 2 via ORAL
  Filled 2015-01-25: qty 2

## 2015-01-25 MED ORDER — IOHEXOL 350 MG/ML SOLN
100.0000 mL | Freq: Once | INTRAVENOUS | Status: AC | PRN
  Administered 2015-01-25: 100 mL via INTRAVENOUS

## 2015-01-25 NOTE — ED Provider Notes (Signed)
CSN: [id number]     Arrival date & time 01/25/15  2118 History   First MD Initiated Contact with Patient 01/25/15 2252     Chief Complaint  Patient presents with  . Loss of Consciousness  . 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 Patient states he has difficulty getting therapeutic on his Coumadin. He has been taking his Coumadin as directed but has been without it since Wednesday. He reports persistent right lower leg pain. He states over the last 2 days he has had 2 episodes of "blacking out".  She describes last remembering being in the living room and then he was found on the bathroom floor by his niece. He then states he remembers being in the kitchen and woke up on the back porch and doesn't remember anything in between. He denies any headache, chest pain. He does report worsening shortness of breath. He states that he feels "foggy headed" and states that his mood has been more labile recently. He also reports difficulty concentrating. Denies any dizziness or vision changes.  He reports an isolated fever on Friday.  Denies drug use.  Past Medical History  Diagnosis Date  . H/O blood clots   . Stroke 2010   Past Surgical History  Procedure Laterality Date  . Leg surgery     History reviewed. No pertinent family history. History  Substance Use Topics  . Smoking status: Never Smoker   . Smokeless tobacco: Not on file  . Alcohol Use: Yes     Comment: social    Review of Systems  Constitutional: Positive for fever.  HENT: Negative for rhinorrhea.   Eyes: Negative for visual disturbance.  Respiratory: Positive for shortness of breath. Negative for cough, chest tightness and wheezing.   Cardiovascular: Positive for leg swelling. Negative for  chest pain.  Gastrointestinal: Negative.  Negative for nausea, vomiting and abdominal pain.  Genitourinary: Positive for hematuria. Negative for dysuria.  Musculoskeletal: Negative for back pain.  Skin: Negative for rash.  Neurological: Positive for syncope. Negative for dizziness, weakness, light-headedness and headaches.  All other systems reviewed and are negative.     Allergies  Amoxicillin; Darvocet; Penicillins; Tramadol; and Xarelto  Home Medications   Prior to Admission medications   Medication Sig Start Date End Date Taking? Authorizing Provider  aspirin EC 81 MG tablet Take 81 mg by mouth daily.   Yes Historical Provider, MD  nitroGLYCERIN (NITROSTAT) 0.4 MG SL tablet Place 0.4 mg under the tongue every 5 (five) minutes as needed for chest pain.   Yes Historical Provider, MD  oxyCODONE-acetaminophen (PERCOCET/ROXICET) 5-325 MG per tablet Take 1 tablet by mouth every 6 (six) hours as needed for moderate pain or severe pain. 12/28/14  Yes Suzi Roots, MD  Enoxaparin Sodium (LOVENOX IJ) Inject as directed daily.    Historical Provider, MD  oxyCODONE-acetaminophen (PERCOCET) 5-325 MG per tablet Take 1 tablet by mouth every 4 (four) hours as needed. Patient not taking: Reported on 01/25/2015 06/25/14   Vida Roller, MD  sulfamethoxazole-trimethoprim (SEPTRA DS) 800-160 MG per tablet Take 1 tablet by mouth every 12 (twelve) hours. Patient not taking: Reported on 12/28/2014 06/25/14   Vida Roller, MD  warfarin (COUMADIN) 7.5 MG tablet Take 7.5 mg by mouth daily.    Historical Provider, MD  warfarin (  COUMADIN) 7.5 MG tablet Take 1 tablet (7.5 mg total) by mouth daily. 01/26/15   Shon Batonourtney F Horton, MD   BP 148/91 mmHg  Pulse 56  Temp(Src) 98.8 F (37.1 C) (Oral)  Resp 20  SpO2 96% Physical Exam  Constitutional: He is oriented to person, place, and time. He appears well-developed and well-nourished. No distress.  HENT:  Head: Normocephalic and atraumatic.  Mouth/Throat: Oropharynx  is clear and moist.  Eyes: EOM are normal. Pupils are equal, round, and reactive to light.  Neck: Neck supple.  Cardiovascular: Normal rate, regular rhythm and normal heart sounds.   No murmur heard. Pulmonary/Chest: Effort normal and breath sounds normal. No respiratory distress. He has no wheezes.  Abdominal: Soft. Bowel sounds are normal. There is no tenderness. There is no rebound.  Musculoskeletal: He exhibits no edema.  Mild swelling of the right lower extremity with tenderness palpation of the calf  Neurological: He is alert and oriented to person, place, and time.  Moves all 4 extremities with 5 out of 5 strength, normal gait  Skin: Skin is warm and dry.  Psychiatric: He has a normal mood and affect.  Nursing note and vitals reviewed.   ED Course  Procedures (including critical care time) Labs Review Labs Reviewed  CBC - Abnormal; Notable for the following:    RBC 4.05 (*)    HCT 38.9 (*)    All other components within normal limits  BASIC METABOLIC PANEL - Abnormal; Notable for the following:    Anion gap 4 (*)    All other components within normal limits  PROTIME-INR  URINALYSIS, ROUTINE W REFLEX MICROSCOPIC  I-STAT TROPOININ, ED    Imaging Review Ct Head Wo Contrast  01/26/2015   CLINICAL DATA:  Loss of consciousness. DVT. Intermittent confusion and dizziness since Friday. Currently taking Coumadin.  EXAM: CT HEAD WITHOUT CONTRAST  TECHNIQUE: Contiguous axial images were obtained from the base of the skull through the vertex without intravenous contrast.  COMPARISON:  10/01/2014  FINDINGS: Mild diffuse cerebral atrophy. No ventricular dilatation. No significant white matter changes. Prominent calcification along the falx. No mass effect or midline shift. No abnormal extra-axial fluid collections. Gray-white matter junctions are distinct. Basal cisterns are not effaced. No evidence of acute intracranial hemorrhage. No depressed skull fractures. Opacification of the  maxillary antra bilaterally with mucosal thickening throughout the ethmoid air cells and sphenoid sinuses. Mastoid air cells are not opacified. Old nasal bone fractures.  IMPRESSION: No acute intracranial abnormalities. Inflammatory changes in the paranasal sinuses.   Electronically Signed   By: Burman NievesWilliam  Stevens M.D.   On: 01/26/2015 00:25   Ct Angio Chest Pe W/cm &/or Wo Cm  01/26/2015   CLINICAL DATA:  Loss of consciousness.  History of DVT.  EXAM: CT ANGIOGRAPHY CHEST WITH CONTRAST  TECHNIQUE: Multidetector CT imaging of the chest was performed using the standard protocol during bolus administration of intravenous contrast. Multiplanar CT image reconstructions and MIPs were obtained to evaluate the vascular anatomy.  CONTRAST:  100mL OMNIPAQUE IOHEXOL 350 MG/ML SOLN  COMPARISON:  10/01/2014  FINDINGS: Technically adequate study with good opacification of the central and segmental pulmonary arteries. No focal filling defects are demonstrated. No evidence of significant pulmonary embolus.  Normal heart size. Normal caliber thoracic aorta. The esophagus is decompressed. No significant lymphadenopathy in the chest.  Mild dependent changes in the lung bases. Focal nodular infiltration in the left lung base could represent focal area of pneumonia. No pleural effusion. No pneumothorax. Scattered emphysematous changes.  Included portions of the upper abdominal organs are grossly unremarkable. Degenerative changes in the spine.  Review of the MIP images confirms the above findings.  IMPRESSION: No evidence of significant pulmonary embolus. Focal nodular infiltration in the left lung base may indicate evidence of pneumonia.   Electronically Signed   By: Burman Nieves M.D.   On: 01/26/2015 00:29     EKG Interpretation   Date/Time:  Sunday January 25 2015 21:50:57 EST Ventricular Rate:  76 PR Interval:  158 QRS Duration: 84 QT Interval:  364 QTC Calculation: 409 R Axis:   72 Text Interpretation:  Normal sinus  rhythm Minimal voltage criteria for  LVH, may be normal variant Early repolarization Borderline ECG similar to  prior Confirmed by HORTON  MD, Toni Amend ([id number]) on 01/25/2015 10:47:06 PM      MDM   Final diagnoses:  Loss of consciousness  DVT (deep venous thrombosis), right  Subtherapeutic anticoagulation  Dehydration    Patient presents with episodes of blacking out, intermittent confusion, mood swings. History of DVT currently on Coumadin.  He is nontoxic and vital signs are reassuring. Mild hypertension noted. He is nonfocal with normal gait. EKG shows no evidence of arrhythmia or QT prolongation.  Given history of DVTs and PEs, would be concerned for recurrent PE given patient is not therapeutic with INR. CT angios chest negative. CT head negative. Basic labwork reassuring.  Orthostatics positive upon standing. Patient appears mildly dehydrated. We will give 1 L of fluid. This could be contributing to patient's episodes of blacking out.  On reevaluation, patient is requesting to get referral to "speak to someone." He states that his mood swings have become more frequent. He denies any suicidal ideation. Patient will be given outpatient resources for psychiatry.  Patient has a primary physician. At this time etiology of blackouts is uncertain but could be related to dehydration. Discussed the patient at length the need to follow-up with his primary physician for further evaluation. Patient was given a second prescription for his Coumadin. Patient stated understanding. He was given strict return precautions.  After history, exam, and medical workup I feel the patient has been appropriately medically screened and is safe for discharge home. Pertinent diagnoses were discussed with the patient. Patient was given return precautions.     Shon Baton, MD 01/26/15 [phone]

## 2015-01-25 NOTE — ED Notes (Signed)
Patient here with complaint of "passing out and waking up in bathroom". States that the last thing he remembers in being in the living room. Also endorses intermittent confusion and dizziness which has been ongoing since Friday. Patient has known DVT in RLE with corresponding pain. Currently taking coumadin.

## 2015-01-26 ENCOUNTER — Encounter (HOSPITAL_COMMUNITY): Payer: Self-pay | Admitting: Radiology

## 2015-01-26 MED ORDER — HYDROCODONE-ACETAMINOPHEN 5-325 MG PO TABS: 1.0000 | ORAL_TABLET | Freq: Four times a day (QID) | ORAL | Status: DC | PRN

## 2015-01-26 MED ORDER — WARFARIN SODIUM 7.5 MG PO TABS: 7.5000 mg | ORAL_TABLET | Freq: Every day | ORAL | Status: DC

## 2015-01-26 MED ORDER — SODIUM CHLORIDE 0.9 % IV BOLUS (SEPSIS)
1000.0000 mL | Freq: Once | INTRAVENOUS | Status: AC
  Administered 2015-01-26: 1000 mL via INTRAVENOUS

## 2015-01-26 NOTE — Discharge Instructions (Signed)
You were seen today for episodes of blacking out. It appears that you're somewhat dehydrated this is potentially the cause given dizziness as well. Your subtherapeutic on her INR and need to continue taking Coumadin. He need follow-up with her primary physician for repeat evaluation and INR check. He will be given a list of resources for someone to talk to regarding her mood swings. If you have any new or worsening symptoms, you should be reevaluated immediately.  Syncope Syncope is a medical term for fainting or passing out. This means you lose consciousness and drop to the ground. People are generally unconscious for less than 5 minutes. You may have some muscle twitches for up to 15 seconds before waking up and returning to normal. Syncope occurs more often in older adults, but it can happen to anyone. While most causes of syncope are not dangerous, syncope can be a sign of a serious medical problem. It is important to seek medical care.  CAUSES  Syncope is caused by a sudden drop in blood flow to the brain. The specific cause is often not determined. Factors that can bring on syncope include:  Taking medicines that lower blood pressure.  Sudden changes in posture, such as standing up quickly.  Taking more medicine than prescribed.  Standing in one place for too long.  Seizure disorders.  Dehydration and excessive exposure to heat.  Low blood sugar (hypoglycemia).  Straining to have a bowel movement.  Heart disease, irregular heartbeat, or other circulatory problems.  Fear, emotional distress, seeing blood, or severe pain. SYMPTOMS  Right before fainting, you may:  Feel dizzy or light-headed.  Feel nauseous.  See all white or all black in your field of vision.  Have cold, clammy skin. DIAGNOSIS  Your health care provider will ask about your symptoms, perform a physical exam, and perform an electrocardiogram (ECG) to record the electrical activity of your heart. Your health  care provider may also perform other heart or blood tests to determine the cause of your syncope which may include:  Transthoracic echocardiogram (TTE). During echocardiography, sound waves are used to evaluate how blood flows through your heart.  Transesophageal echocardiogram (TEE).  Cardiac monitoring. This allows your health care provider to monitor your heart rate and rhythm in real time.  Holter monitor. This is a portable device that records your heartbeat and can help diagnose heart arrhythmias. It allows your health care provider to track your heart activity for several days, if needed.  Stress tests by exercise or by giving medicine that makes the heart beat faster. TREATMENT  In most cases, no treatment is needed. Depending on the cause of your syncope, your health care provider may recommend changing or stopping some of your medicines. HOME CARE INSTRUCTIONS  Have someone stay with you until you feel stable.  Do not drive, use machinery, or play sports until your health care provider says it is okay.  Keep all follow-up appointments as directed by your health care provider.  Lie down right away if you start feeling like you might faint. Breathe deeply and steadily. Wait until all the symptoms have passed.  Drink enough fluids to keep your urine clear or pale yellow.  If you are taking blood pressure or heart medicine, get up slowly and take several minutes to sit and then stand. This can reduce dizziness. SEEK IMMEDIATE MEDICAL CARE IF:   You have a severe headache.  You have unusual pain in the chest, abdomen, or back.  You are bleeding  from your mouth or rectum, or you have black or tarry stool.  You have an irregular or very fast heartbeat.  You have pain with breathing.  You have repeated fainting or seizure-like jerking during an episode.  You faint when sitting or lying down.  You have confusion.  You have trouble walking.  You have severe  weakness.  You have vision problems. If you fainted, call your local emergency services (911 in U.S.). Do not drive yourself to the hospital.  MAKE SURE YOU:  Understand these instructions.  Will watch your condition.  Will get help right away if you are not doing well or get worse. Document Released: 11/07/2005 Document Revised: 11/12/2013 Document Reviewed: 01/06/2012 [facility] Patient Information 2015 [place], [place]. This information is not intended to replace advice given to you by your health care provider. Make sure you discuss any questions you have with your health care provider.   Emergency Department Resource Guide 1) Find a Doctor and Pay Out of Pocket Although you won't have to find out who is covered by your insurance plan, it is a good idea to ask around and get recommendations. You will then need to call the office and see if the doctor you have chosen will accept you as a new patient and what types of options they offer for patients who are self-pay. Some doctors offer discounts or will set up payment plans for their patients who do not have insurance, but you will need to ask so you aren't surprised when you get to your appointment.  2) Contact Your Local Health Department Not all health departments have doctors that can see patients for sick visits, but many do, so it is worth a call to see if yours does. If you don't know where your local health department is, you can check in your phone book. The CDC also has a tool to help you locate your state's health department, and many state websites also have listings of all of their local health departments.  3) Find a Walk-in Clinic If your illness is not likely to be very severe or complicated, you may want to try a walk in clinic. These are popping up all over the country in pharmacies, drugstores, and shopping centers. They're usually staffed by nurse practitioners or physician assistants that have been trained to treat common  illnesses and complaints. They're usually fairly quick and inexpensive. However, if you have serious medical issues or chronic medical problems, these are probably not your best option.  No Primary Care Doctor: - Call Health Connect at  [phone] - they can help you locate a primary care doctor that  accepts your insurance, provides certain services, etc. - Physician Referral Service- [phone]  Chronic Pain Problems: Organization         Address  Phone   Notes  [facility]  [phone] Patients need to be referred by their primary care doctor.   Medication Assistance: Organization         Address  Phone   Notes  [facility] Medication [facility] [address] [place] [place], [place] [address] [phone] --Must be a resident of [facility] -- Must have NO insurance coverage whatsoever (no Medicaid/ Medicare, etc.) -- The pt. MUST have a primary care doctor that directs their care regularly and follows them in the community   MedAssist  [phone]   [employer]  [phone]    Agencies that provide inexpensive medical care: Organization  Address  Phone   Notes  [facility]  [phone]   [facility]    [phone]   [facility] [address] [place], [place] [id number] [phone]) [phone]   [facility] [address], [place] [phone]   [facility]anned Parenthood    [phone](336) [phone]   [facility]    [phone](336) [phone]   [facility] and [facility]  [address], [place] Phone:  [phone](336) [phone], Fax:  [phone] Hours of Operation:  9 am - 6 pm, M-F.  Also accepts Medicaid/Medicare and self-pay.  [facility]  [address], [place] Phone: [phone](336) [phone], Fax: [phone]. Hours of Operation:  8:30 am - 5:30 pm, M-F.  Also accepts Medicaid and self-pay.  [facility] [place]  [address], [place] Phone: [phone](336) [phone]   [facility] [address], [place], [place] [phone], Ext. [phone] Mondays & Thursdays: 7-9 AM.  First 15 patients are seen on a first come, first serve basis.    Medicaid-accepting [facility] Providers:  Organization         Address  Phone   Notes  [facility] [address], [place] [phone] Also accepts self-pay patients.  [facility] [address], [place]  [phone]) [phone]   [facility] [address], [place] [phone]) [phone]   [facility] [address] [place] [address], [place] [phone]) [phone]   Renaye RakersVeita Bland [address], [place]   [phone] Only accepts [place] Access [place] patients after they have their name applied to their card.   Self-Pay (no insurance) in [facility]:  Organization         Address  Phone   Notes  Sickle Cell Patients, [facility] [address], [place] [phone]) [phone]   [facility] [address], [place] [phone]) [phone]   [facility] [place]  [address], [place] [phone]) [phone]   [facility]/Dr. Osei-Bonsu  [address] [place] [address], [place] or [phone], [place] [phone]) [phone] Phone number for both [place] and [place] locations is the same.  [facility]gent [facility] and [facility] [address], [place] [phone]   [facility] [place] [address] [place] [address], [place] or [address] [phone](336) [phone]   [facility] [address], [place] [phone](336) [phone], phone; [phone]) [phone], fax Sees patients 1st and 3rd Saturday of every month.  Must not qualify for public or private insurance (i.e. Medicaid, Medicare, [place] Health Choice, Veterans' Benefits)  Household income should be no more than 200% of the  poverty level The clinic cannot treat you if you are pregnant or think you are pregnant  Sexually transmitted diseases are not treated at the clinic.    Dental Care: Organization         Address  Phone  Notes  [facility] of [facility] [address], [place] [phone]) [phone] Accepts children up to age 44 who are enrolled in [place] or [place] Health Choice; pregnant women with a Medicaid card; and children who have applied for Medicaid or [place] Health Choice, but were declined, whose parents can pay a reduced fee at time  of service.  [facility] of [facility]  [address], [place] [phone] Accepts children up to age 75 who are enrolled in [place] or [place] Health Choice; pregnant women with a Medicaid card; and children who have applied for Medicaid or [place] Health Choice, but were declined, whose parents can pay a reduced fee at time of service.  [facility]  [address] [place], [place] [phone] Patients are seen by appointment only. Walk-ins are not accepted. [facility] will see patients 74 years of age and older. Monday - Tuesday (8am-5pm) Most Wednesdays (8:30-5pm) $30 per visit, cash only  [facility] Adult Dental Access PROGRAM  [address], [facility] [phone] Patients are seen by appointment only. Walk-ins are not accepted. [facility] will see patients 72 years of age and older. One Wednesday Evening (Monthly: Volunteer Based).  $30 per visit, cash only  [employer]  [phone] for adults; Children under age 51, call Graduate Pediatric Dentistry at [phone]. Children aged 105-14, please call [phone] to request a pediatric application.  Dental services are provided in all areas of dental care including fillings, crowns and bridges, complete and partial dentures, implants, gum treatment, root canals, and extractions.  Preventive care is also provided. Treatment is provided to both adults and children. Patients are selected via a lottery and there is often a waiting list.   [facility] [address], [place]  [phone] [email or web address]   [facility] [address] [place], [place] [phone], Ext. [phone] Second and Fourth Thursday of each month, opens at 6:30 AM; Clinic ends at 9 AM.  Patients are seen on a first-come first-served basis, and a limited number are seen during each clinic.   [facility]  [address] [place], [place] [phone]   Eligibility Requirements You must have lived in [place], [place], or [place] counties for at least the last three months.   You cannot be eligible for state or federal sponsored [place], including [employer], [place], or [employer].   You generally cannot be eligible for healthcare insurance through your employer.    How to apply: Eligibility screenings are held every Tuesday and Wednesday afternoon from 1:00 pm until 4:00 pm. You do not need an appointment for the interview!  [facility] [address] [place], [place] [phone]   [facility] Health Department  [phone]   [facility] Health Department  [phone]   [facility] Health Department  [phone]    Behavioral Health Resources in the Community: Intensive Outpatient Programs Organization         Address  Phone  Notes  [facility] [address] [place], [place] [phone]   [facility] [address], [place], [place] [phone]   [facility] [address] [place], [place]  [phone]   [facility] [address]  [place], [place] [phone] or [phone]   Substance Abuse Resources Organization         Address  Phone  Notes  Alcohol and Drug Services  [phone]   Addiction  [facility]  [phone]   [facility] [place]  [phone]   Floydene Flock  [phone]   Residential & Outpatient Substance Abuse Program  [phone]   Psychological Services Organization         Address  Phone  Notes  [employer] [facility]  [phone]   [facility] Services  [phone]   [facility] Mental Health [address], [place] [phone] or [phone]    Mobile Crisis Teams Organization         Address  Phone  Notes  [facility], Mobile Crisis Care Unit  [phone]   [facility]  [address] [place], [place] [phone]   Doristine Locks [address] [place] [phone]    Self-Help/Support Groups Organization         Address  Phone             Notes  [facility] [place] - variety of support groups  [phone]- [phone] Call for more information  Narcotics Anonymous (NA), [facility] [address], [employer] [place]  2 meetings at this location   Statistician         Address  Phone  Notes  [facility] [address],    [place]  [phone]   [facility]  [address] [place] [address], [place], [place] [phone]   [facility] [address] [place], [place] [phone] Admissions: 8am-3pm M-F  [facility] [address],    [place], [place] [phone]   [facility] [address] [place], [place], [place] [phone]   [facility] [address]  [place], [place] [phone]   [facility] - Intensive Outpatient [address], [place], [place] [phone]   [facility] [address] [place].,  [place], [place] [phone] or [phone]   [facility] ([facility]) [address] [place], [place] [phone] Accepts Medicaid  [facility] [address],  [place]  [phone] Substance Abuse/Addiction Treatment   [facility] Organization         Address  Phone  Notes  [facility]  [phone]   Angie Fava, PhD [address] [place], [place]   [phone] or [phone]   [facility] Behavioral   [address] [place], [place] [phone]   [facility] [address] [place], [place] [phone] Insurance/Medicaid/sponsorship through [facility] and Families [address]                                    [place], [place] [phone] Therapy/tele-psych/case  [facility] [address], [place] [phone]    Dr. Lolly Mustache  [phone]   [facility]  [employer] [facility] Dept. 1) [address] [place] 2) [address], [place] 3)  [address], [place] [phone]  [phone]   [facility] Child Abuse Hotline 640-261-5297 or [phone] (After Hours)

## 2015-09-10 ENCOUNTER — Encounter (HOSPITAL_COMMUNITY): Payer: Self-pay | Admitting: Emergency Medicine

## 2015-09-10 ENCOUNTER — Emergency Department (HOSPITAL_COMMUNITY)
Admission: EM | Admit: 2015-09-10 | Discharge: 2015-09-10 | Discharge status: Against Medical Advice | Payer: PRIVATE HEALTH INSURANCE | Attending: Emergency Medicine | Admitting: Emergency Medicine

## 2015-09-10 DIAGNOSIS — Y9289 Other specified places as the place of occurrence of the external cause: Secondary | ICD-10-CM | POA: Diagnosis not present

## 2015-09-10 DIAGNOSIS — S0990XA Unspecified injury of head, initial encounter: Secondary | ICD-10-CM | POA: Diagnosis present

## 2015-09-10 DIAGNOSIS — Y998 Other external cause status: Secondary | ICD-10-CM | POA: Diagnosis not present

## 2015-09-10 DIAGNOSIS — Y9389 Activity, other specified: Secondary | ICD-10-CM | POA: Diagnosis not present

## 2015-09-10 DIAGNOSIS — W228XXA Striking against or struck by other objects, initial encounter: Secondary | ICD-10-CM | POA: Diagnosis not present

## 2015-09-10 NOTE — ED Notes (Signed)
Pt reports 2 days ago he fell hitting the L side of face on stair case. Questionable loc. Pt c/o continued pain and swelling. No blood thinners.

## 2015-09-11 ENCOUNTER — Emergency Department (HOSPITAL_COMMUNITY): Payer: PRIVATE HEALTH INSURANCE

## 2015-09-11 ENCOUNTER — Emergency Department (HOSPITAL_COMMUNITY): Payer: PRIVATE HEALTH INSURANCE | Admitting: Anesthesiology

## 2015-09-11 ENCOUNTER — Encounter (HOSPITAL_COMMUNITY)
Admission: EM | Disposition: A | Discharge status: Home / Self Care | Payer: Self-pay | Source: Home / Self Care | Attending: Emergency Medicine

## 2015-09-11 ENCOUNTER — Encounter (HOSPITAL_COMMUNITY): Payer: Self-pay | Admitting: Cardiology

## 2015-09-11 ENCOUNTER — Observation Stay (HOSPITAL_COMMUNITY)
Admission: EM | Admit: 2015-09-11 | Discharge: 2015-09-12 | Disposition: A | Discharge status: Home / Self Care | Payer: PRIVATE HEALTH INSURANCE | Attending: Otolaryngology | Admitting: Otolaryngology

## 2015-09-11 DIAGNOSIS — Z7901 Long term (current) use of anticoagulants: Secondary | ICD-10-CM | POA: Diagnosis not present

## 2015-09-11 DIAGNOSIS — Z79891 Long term (current) use of opiate analgesic: Secondary | ICD-10-CM | POA: Insufficient documentation

## 2015-09-11 DIAGNOSIS — S022XXA Fracture of nasal bones, initial encounter for closed fracture: Secondary | ICD-10-CM | POA: Diagnosis not present

## 2015-09-11 DIAGNOSIS — Z7982 Long term (current) use of aspirin: Secondary | ICD-10-CM | POA: Insufficient documentation

## 2015-09-11 DIAGNOSIS — S02609A Fracture of mandible, unspecified, initial encounter for closed fracture: Secondary | ICD-10-CM

## 2015-09-11 DIAGNOSIS — R6884 Jaw pain: Secondary | ICD-10-CM | POA: Diagnosis present

## 2015-09-11 DIAGNOSIS — W109XXA Fall (on) (from) unspecified stairs and steps, initial encounter: Secondary | ICD-10-CM | POA: Diagnosis not present

## 2015-09-11 DIAGNOSIS — Z8673 Personal history of transient ischemic attack (TIA), and cerebral infarction without residual deficits: Secondary | ICD-10-CM | POA: Diagnosis not present

## 2015-09-11 DIAGNOSIS — Z86718 Personal history of other venous thrombosis and embolism: Secondary | ICD-10-CM | POA: Diagnosis not present

## 2015-09-11 DIAGNOSIS — S02640D Fracture of ramus of mandible, unspecified side, subsequent encounter for fracture with routine healing: Secondary | ICD-10-CM

## 2015-09-11 DIAGNOSIS — S02642A Fracture of ramus of left mandible, initial encounter for closed fracture: Principal | ICD-10-CM | POA: Insufficient documentation

## 2015-09-11 HISTORY — PX: ORIF MANDIBULAR FRACTURE: SHX2127

## 2015-09-11 HISTORY — PX: CLOSED REDUCTION NASAL FRACTURE: SHX5365

## 2015-09-11 SURGERY — OPEN REDUCTION INTERNAL FIXATION (ORIF) MANDIBULAR FRACTURE
Anesthesia: General

## 2015-09-11 MED ORDER — GLYCOPYRROLATE 0.2 MG/ML IJ SOLN
INTRAMUSCULAR | Status: AC
  Filled 2015-09-11: qty 2

## 2015-09-11 MED ORDER — ONDANSETRON HCL 4 MG/2ML IJ SOLN
INTRAMUSCULAR | Status: DC | PRN
  Administered 2015-09-11: 4 mg via INTRAVENOUS

## 2015-09-11 MED ORDER — MIDAZOLAM HCL 2 MG/2ML IJ SOLN
INTRAMUSCULAR | Status: AC
  Filled 2015-09-11: qty 4

## 2015-09-11 MED ORDER — PROPOFOL 10 MG/ML IV BOLUS
INTRAVENOUS | Status: AC
  Filled 2015-09-11: qty 20

## 2015-09-11 MED ORDER — NEOSTIGMINE METHYLSULFATE 10 MG/10ML IV SOLN
INTRAVENOUS | Status: AC
  Filled 2015-09-11: qty 1

## 2015-09-11 MED ORDER — OXYMETAZOLINE HCL 0.05 % NA SOLN
NASAL | Status: AC
  Filled 2015-09-11: qty 15

## 2015-09-11 MED ORDER — ROCURONIUM BROMIDE 50 MG/5ML IV SOLN
INTRAVENOUS | Status: AC
  Filled 2015-09-11: qty 1

## 2015-09-11 MED ORDER — LIDOCAINE-EPINEPHRINE 1 %-1:100000 IJ SOLN
INTRAMUSCULAR | Status: DC | PRN
  Administered 2015-09-11: 4 mL

## 2015-09-11 MED ORDER — HYDROMORPHONE HCL 1 MG/ML IJ SOLN
INTRAMUSCULAR | Status: AC
  Filled 2015-09-11: qty 1

## 2015-09-11 MED ORDER — SUCCINYLCHOLINE CHLORIDE 20 MG/ML IJ SOLN
INTRAMUSCULAR | Status: DC | PRN
  Administered 2015-09-11: 120 mg via INTRAVENOUS

## 2015-09-11 MED ORDER — SUCCINYLCHOLINE CHLORIDE 20 MG/ML IJ SOLN
INTRAMUSCULAR | Status: AC
  Filled 2015-09-11: qty 1

## 2015-09-11 MED ORDER — HYDROMORPHONE HCL 1 MG/ML IJ SOLN
0.2500 mg | INTRAMUSCULAR | Status: DC | PRN
  Administered 2015-09-11 (×4): 0.5 mg via INTRAVENOUS

## 2015-09-11 MED ORDER — LIDOCAINE HCL (CARDIAC) 20 MG/ML IV SOLN
INTRAVENOUS | Status: AC
  Filled 2015-09-11: qty 5

## 2015-09-11 MED ORDER — BACITRACIN ZINC 500 UNIT/GM EX OINT
TOPICAL_OINTMENT | CUTANEOUS | Status: AC
  Filled 2015-09-11: qty 28.35

## 2015-09-11 MED ORDER — PROMETHAZINE HCL 25 MG/ML IJ SOLN: 6.2500 mg | INTRAMUSCULAR | Status: DC | PRN

## 2015-09-11 MED ORDER — LACTATED RINGERS IV SOLN
INTRAVENOUS | Status: DC | PRN
  Administered 2015-09-11: 22:00:00 via INTRAVENOUS

## 2015-09-11 MED ORDER — MORPHINE SULFATE (PF) 4 MG/ML IV SOLN
4.0000 mg | Freq: Once | INTRAVENOUS | Status: AC
  Administered 2015-09-11: 4 mg via INTRAVENOUS
  Filled 2015-09-11: qty 1

## 2015-09-11 MED ORDER — ONDANSETRON HCL 4 MG/2ML IJ SOLN
4.0000 mg | Freq: Once | INTRAMUSCULAR | Status: AC
  Administered 2015-09-11: 4 mg via INTRAVENOUS
  Filled 2015-09-11: qty 2

## 2015-09-11 MED ORDER — OXYMETAZOLINE HCL 0.05 % NA SOLN
NASAL | Status: DC | PRN
  Administered 2015-09-11: 1 via TOPICAL

## 2015-09-11 MED ORDER — MIDAZOLAM HCL 5 MG/5ML IJ SOLN
INTRAMUSCULAR | Status: DC | PRN
  Administered 2015-09-11: 2 mg via INTRAVENOUS

## 2015-09-11 MED ORDER — LIDOCAINE HCL (CARDIAC) 20 MG/ML IV SOLN
INTRAVENOUS | Status: DC | PRN
Start: 2015-09-11 — End: 2015-09-11
  Administered 2015-09-11: 100 mg via INTRAVENOUS

## 2015-09-11 MED ORDER — CLINDAMYCIN PALMITATE HCL 75 MG/5ML PO SOLR
300.0000 mg | Freq: Three times a day (TID) | ORAL | Status: DC
  Administered 2015-09-12: 300 mg via ORAL
  Filled 2015-09-11 (×5): qty 20

## 2015-09-11 MED ORDER — CLINDAMYCIN PHOSPHATE 600 MG/50ML IV SOLN
INTRAVENOUS | Status: DC | PRN
  Administered 2015-09-11: 600 mg via INTRAVENOUS

## 2015-09-11 MED ORDER — LIDOCAINE-EPINEPHRINE 1 %-1:100000 IJ SOLN
INTRAMUSCULAR | Status: AC
  Filled 2015-09-11: qty 1

## 2015-09-11 MED ORDER — PROPOFOL 10 MG/ML IV BOLUS
INTRAVENOUS | Status: DC | PRN
  Administered 2015-09-11: 200 mg via INTRAVENOUS

## 2015-09-11 MED ORDER — SALINE SPRAY 0.65 % NA SOLN
2.0000 | Freq: Three times a day (TID) | NASAL | Status: DC
  Filled 2015-09-11 (×2): qty 44

## 2015-09-11 MED ORDER — FENTANYL CITRATE (PF) 100 MCG/2ML IJ SOLN
INTRAMUSCULAR | Status: DC | PRN
  Administered 2015-09-11: 100 ug via INTRAVENOUS
  Administered 2015-09-11: 50 ug via INTRAVENOUS
  Administered 2015-09-11: 100 ug via INTRAVENOUS

## 2015-09-11 MED ORDER — FENTANYL CITRATE (PF) 250 MCG/5ML IJ SOLN
INTRAMUSCULAR | Status: AC
  Filled 2015-09-11: qty 5

## 2015-09-11 MED ORDER — CLINDAMYCIN PHOSPHATE 600 MG/50ML IV SOLN
INTRAVENOUS | Status: AC
  Filled 2015-09-11: qty 50

## 2015-09-11 MED ORDER — BACITRACIN ZINC 500 UNIT/GM EX OINT
TOPICAL_OINTMENT | CUTANEOUS | Status: DC | PRN
  Administered 2015-09-11: 1 via TOPICAL

## 2015-09-11 SURGICAL SUPPLY — 40 items
BENZOIN TINCTURE PRP APPL 2/3 (GAUZE/BANDAGES/DRESSINGS) ×3 IMPLANT
BLADE SURG 15 STRL LF DISP TIS (BLADE) IMPLANT
BLADE SURG 15 STRL SS (BLADE)
CANISTER SUCTION 2500CC (MISCELLANEOUS) ×3 IMPLANT
CLEANER TIP ELECTROSURG 2X2 (MISCELLANEOUS) ×3 IMPLANT
COVER SURGICAL LIGHT HANDLE (MISCELLANEOUS) ×3 IMPLANT
CRADLE DONUT ADULT HEAD (MISCELLANEOUS) IMPLANT
DRAPE PROXIMA HALF (DRAPES) IMPLANT
DRESSING NASAL KENNEDY 3.5X.9 (MISCELLANEOUS) ×2 IMPLANT
DRSG NASAL KENNEDY 3.5X.9 (MISCELLANEOUS) ×3
ELECT COATED BLADE 2.86 ST (ELECTRODE) ×3 IMPLANT
ELECT REM PT RETURN 9FT ADLT (ELECTROSURGICAL) ×3
ELECTRODE REM PT RTRN 9FT ADLT (ELECTROSURGICAL) ×2 IMPLANT
GLOVE BIO SURGEON STRL SZ7 (GLOVE) ×3 IMPLANT
GLOVE BIO SURGEON STRL SZ7.5 (GLOVE) ×3 IMPLANT
GOWN STRL REUS W/ TWL LRG LVL3 (GOWN DISPOSABLE) ×4 IMPLANT
GOWN STRL REUS W/TWL LRG LVL3 (GOWN DISPOSABLE) ×2
KIT BASIN OR (CUSTOM PROCEDURE TRAY) ×3 IMPLANT
KIT ROOM TURNOVER OR (KITS) ×3 IMPLANT
NEEDLE 27GAX1X1/2 (NEEDLE) ×3 IMPLANT
NEEDLE HYPO 25GX1X1/2 BEV (NEEDLE) IMPLANT
NS IRRIG 1000ML POUR BTL (IV SOLUTION) ×3 IMPLANT
PAD ARMBOARD 7.5X6 YLW CONV (MISCELLANEOUS) ×6 IMPLANT
PENCIL BUTTON HOLSTER BLD 10FT (ELECTRODE) ×3 IMPLANT
PLATE HYBRID MMF (Plate) ×3 IMPLANT
SCISSORS WIRE ANG 4 3/4 DISP (INSTRUMENTS) ×3 IMPLANT
SCREW UPPER FACE 2.0X8MM (Screw) ×24 IMPLANT
SCREWDRIVER BLADE UNI 2.0/2.3 (BLADE) ×3 IMPLANT
SPLINT NASAL THERMO PLAST (MISCELLANEOUS) ×3 IMPLANT
STRIP CLOSURE SKIN 1/2X4 (GAUZE/BANDAGES/DRESSINGS) ×3 IMPLANT
SUT ETHILON 4 0 CL P 3 (SUTURE) IMPLANT
SUT MON AB 3-0 SH 27 (SUTURE) ×1
SUT MON AB 3-0 SH27 (SUTURE) ×2 IMPLANT
SUT PROLENE 6 0 PC 1 (SUTURE) IMPLANT
SUT STEEL 2 (SUTURE) IMPLANT
SUT STEEL 4 (SUTURE) ×3 IMPLANT
SUT VICRYL 4-0 PS2 18IN ABS (SUTURE) IMPLANT
TOWEL OR 17X24 6PK STRL BLUE (TOWEL DISPOSABLE) ×3 IMPLANT
TRAY ENT MC OR (CUSTOM PROCEDURE TRAY) ×3 IMPLANT
WATER STERILE IRR 1000ML POUR (IV SOLUTION) ×3 IMPLANT

## 2015-09-11 NOTE — Anesthesia Procedure Notes (Signed)
Procedure Name: Intubation Date/Time: 09/11/2015 10:02 PM Performed by: Hamza Empson S Pre-anesthesia Checklist: Patient identified, Timeout performed, Emergency Drugs available, Suction available and Patient being monitored Patient Re-evaluated:Patient Re-evaluated prior to inductionOxygen Delivery Method: Circle system utilized Preoxygenation: Pre-oxygenation with 100% oxygen Intubation Type: IV induction Ventilation: Mask ventilation without difficulty Laryngoscope Size: Mac and 4 Grade View: Grade I Nasal Tubes: Right, Magill forceps- large, utilized, Nasal prep performed and Nasal Rae Tube size: 7.5 mm Number of attempts: 1 Placement Confirmation: ETT inserted through vocal cords under direct vision,  positive ETCO2 and breath sounds checked- equal and bilateral Tube secured with: Tape Dental Injury: Teeth and Oropharynx as per pre-operative assessment

## 2015-09-11 NOTE — Anesthesia Preprocedure Evaluation (Addendum)
Anesthesia Evaluation  Patient identified by MRN, date of birth, ID band Patient awake    Reviewed: Allergy & Precautions, NPO status , Patient's Chart, lab work & pertinent test results  Airway Mallampati: IV  TM Distance: >3 FB Neck ROM: Full  Mouth opening: Limited Mouth Opening Comment: Limited exam due to pain with mouth opening Dental  (+) Dental Advisory Given   Pulmonary neg pulmonary ROS,    breath sounds clear to auscultation       Cardiovascular negative cardio ROS   Rhythm:Regular Rate:Normal     Neuro/Psych CVA    GI/Hepatic negative GI ROS, Neg liver ROS,   Endo/Other  negative endocrine ROS  Renal/GU negative Renal ROS     Musculoskeletal   Abdominal   Peds  Hematology negative hematology ROS (+)   Anesthesia Other Findings   Reproductive/Obstetrics                            Anesthesia Physical Anesthesia Plan  ASA: II  Anesthesia Plan: General   Post-op Pain Management:    Induction: Intravenous  Airway Management Planned: Nasal ETT  Additional Equipment:   Intra-op Plan:   Post-operative Plan: Extubation in OR  Informed Consent: I have reviewed the patients History and Physical, chart, labs and discussed the procedure including the risks, benefits and alternatives for the proposed anesthesia with the patient or authorized representative who has indicated his/her understanding and acceptance.   Dental advisory given  Plan Discussed with: CRNA  Anesthesia Plan Comments:        Anesthesia Quick Evaluation

## 2015-09-11 NOTE — ED Provider Notes (Signed)
CSN: [id number]     Arrival date & time 09/11/15  1748 History  By signing my name below, I, Placido Sou, attest that this documentation has been prepared under the direction and in the presence of Fayrene Helper, PA-C. Electronically Signed: Placido Sou, ED Scribe. 09/11/2015. 6:31 PM.   Chief Complaint  Patient presents with  . Jaw Pain   The history is provided by the patient. No language interpreter was used.   HPI Comments: [name] [name] is a 44 y.o. male who presents to the Emergency Department due to a fall that occurred 2 days ago. Pt notes that he fell down a stair case (about 12 stairs) due to feeling a sharp pain in his right leg and while reaching for it lost his balance. He notes landing on his face with resulting, moderate, pain and swelling that he describes as "excruciating" to his left jaw that radiates to his left ear. He also notes associated, moderate, HA and neck pain. Pt notes taking oxycodone currently for pain management. He notes a hx of DVT many years ago and has not taken blood thinning medication since April of 2016. He denies any other associated symptoms.   Past Medical History  Diagnosis Date  . H/O blood clots   . Stroke [facility]) 2010   Past Surgical History  Procedure Laterality Date  . Leg surgery     History reviewed. No pertinent family history. Social History  Substance Use Topics  . Smoking status: Never Smoker   . Smokeless tobacco: None  . Alcohol Use: Yes     Comment: social    Review of Systems  HENT: Positive for facial swelling.   Musculoskeletal: Positive for myalgias, neck pain and neck stiffness.  Skin: Negative for wound.  Neurological: Positive for headaches.   Allergies  Amoxicillin; Darvocet; Penicillins; Tramadol; and Xarelto  Home Medications   Prior to Admission medications   Medication Sig Start Date End Date Taking? Authorizing Provider  aspirin EC 81 MG tablet Take 81 mg by mouth daily.    Historical Provider, MD   Enoxaparin Sodium (LOVENOX IJ) Inject as directed daily.    Historical Provider, MD  HYDROcodone-acetaminophen (NORCO/VICODIN) 5-325 MG per tablet Take 1 tablet by mouth every 6 (six) hours as needed for moderate pain. 01/26/15   Shon Baton, MD  nitroGLYCERIN (NITROSTAT) 0.4 MG SL tablet Place 0.4 mg under the tongue every 5 (five) minutes as needed for chest pain.    Historical Provider, MD  oxyCODONE-acetaminophen (PERCOCET) 5-325 MG per tablet Take 1 tablet by mouth every 4 (four) hours as needed. Patient not taking: Reported on 01/25/2015 06/25/14   Eber Hong, MD  oxyCODONE-acetaminophen (PERCOCET/ROXICET) 5-325 MG per tablet Take 1 tablet by mouth every 6 (six) hours as needed for moderate pain or severe pain. 12/28/14   Cathren Laine, MD  sulfamethoxazole-trimethoprim (SEPTRA DS) 800-160 MG per tablet Take 1 tablet by mouth every 12 (twelve) hours. Patient not taking: Reported on 12/28/2014 06/25/14   Eber Hong, MD  warfarin (COUMADIN) 7.5 MG tablet Take 7.5 mg by mouth daily.    Historical Provider, MD  warfarin (COUMADIN) 7.5 MG tablet Take 1 tablet (7.5 mg total) by mouth daily. 01/26/15   Shon Baton, MD   BP 120/79 mmHg  Pulse 87  Temp(Src) 99 F (37.2 C) (Oral)  Resp 16  Ht  (1.803 m)  Wt 169 lb 1.5 oz (76.7 kg)  BMI 23.59 kg/m2  SpO2 99% Physical Exam  Constitutional: He is  oriented to person, place, and time. He appears well-developed and well-nourished.  HENT:  Head: Normocephalic and atraumatic.  Right Ear: External ear normal. No hemotympanum.  Left Ear: External ear normal. No hemotympanum.  Nose: No nasal septal hematoma.  Mouth/Throat: No oropharyngeal exudate.  Right temporal region with mild swelling noted and no crepitus; mild tenderness to left side of jaw along radius of jaw with associated edema; DROM of jaw secondary to pain; trismus noted; tenderness to mid face  Neck: Normal range of motion. No tracheal deviation present.  Trachea midline   Cardiovascular: Normal rate and normal heart sounds.  Exam reveals no gallop and no friction rub.   No murmur heard. Pulmonary/Chest: Effort normal. No respiratory distress. He has no wheezes. He has no rales. He exhibits no tenderness.  Abdominal: Soft. There is no tenderness.  Musculoskeletal: He exhibits tenderness.  TTP of cervical midline spine; no TTP of thoracic midline spine; skin abrasion noted to medial left elbow with normal flexion and extension; no gross deformities  Neurological: He is alert and oriented to person, place, and time.  Skin: Skin is warm and dry. He is not diaphoretic.  Psychiatric: He has a normal mood and affect. His behavior is normal.  Nursing note and vitals reviewed.  ED Course  Angiocath insertion Date/Time: 09/11/2015 8:02 PM Performed by: Fayrene Helper Authorized by: Fayrene Helper Consent: Verbal consent obtained. Risks and benefits: risks, benefits and alternatives were discussed Consent given by: patient Patient understanding: patient states understanding of the procedure being performed Patient identity confirmed: verbally with patient Preparation: Patient was prepped and draped in the usual sterile fashion. Local anesthesia used: no Patient sedated: no Patient tolerance: Patient tolerated the procedure well with no immediate complications Comments: IV access to L antecubital    DIAGNOSTIC STUDIES: Oxygen Saturation is 99% on RA, normal by my interpretation.    COORDINATION OF CARE: 6:13 PM Pt presents today due to a fall down a staircase that occurred 2 days ago. Discussed treatment plan with pt at bedside including imaging of the affected region .   7:59 PM CT scan of maxillofacial demonstrates nondisplaced L mandibular ramus fracture.  Bilateral nasal bone fx with minimal depression. cspine xray is normal.  Pt however is having moderate pain, trismus, and upon closer inspection there is a 2mm right shift of lower jaw at the level of the  molar.  Appreciate consultation from ENT Dr. Jenne Pane who will see pt in ER and will bring pt to OR for jaw wire procedure.  Pt agrees with plan.  IV access was placed by me     Imaging Review Dg Cervical Spine Complete  09/11/2015  CLINICAL DATA:  Fall down stairs two days ago with persistent neck pain, initial encounter EXAM: CERVICAL SPINE - COMPLETE 4+ VIEW COMPARISON:  None. FINDINGS: There is no evidence of cervical spine fracture or prevertebral soft tissue swelling. Alignment is normal. No other significant bone abnormalities are identified. IMPRESSION: No acute abnormality noted. Electronically Signed   By: Alcide Clever M.D.   On: 09/11/2015 18:51   Ct Maxillofacial Wo Cm  09/11/2015  CLINICAL DATA:  Fall down steps, left facial injury, toe pain/swelling EXAM: CT MAXILLOFACIAL WITHOUT CONTRAST TECHNIQUE: Multidetector CT imaging of the maxillofacial structures was performed. Multiplanar CT image reconstructions were also generated. A small metallic BB was placed on the right temple in order to reliably differentiate right from left. COMPARISON:  [facility] maxillofacial CT dated 09/09/2015 FINDINGS: Nondisplaced left mandibular ramus fracture (series  9/ image 69), just beneath the mandibular condyle, unchanged. Mandible is otherwise intact. Bilateral mandibular condyles are well-seated in the TMJs. Stable bilateral nasal bone fractures with minimal depression (series 3/ image 56), unchanged. Anterior nasal septum is mildly deviated to the left (series 3/image 7), without definite fracture. No nasal septal hematoma. No additional fracture is seen. Bilateral orbits, including the globes and retroconal soft tissues, are within normal limits. Partial opacification of the bilateral maxillary and ethmoid sinuses. Mastoid air cells are clear. Visualized brain parenchyma is unremarkable. Mildly prominent bilateral cervical lymph nodes, including a 12 mm short axis ovoid left submandibular node with  preservation of the normal fatty hilum (series 2/ image 13), unchanged, likely reactive. Cervical spine is intact to C5-6. IMPRESSION: Nondisplaced left mandibular ramus fracture, unchanged. Mandible is otherwise intact. Bilateral mandibular condyles are well-seated in the TMJs. Bilateral nasal bone fractures with minimal depression, unchanged. Electronically Signed   By: Charline BillsSriyesh  Krishnan M.D.   On: 09/11/2015 19:08   I have personally reviewed and evaluated these images and lab results as part of my medical decision-making.  MDM   Final diagnoses:  Jaw fracture, closed, initial encounter ([name]  Nasal bone fracture, closed, initial encounter    BP 120/79 mmHg  Pulse 87  Temp(Src) 99 F (37.2 C) (Oral)  Resp 16  Ht 5\' 11"  (1.803 m)  Wt 169 lb 1.5 oz (76.7 kg)  BMI 23.59 kg/m2  SpO2 99%  I, Darrow Barreiro, personally performed the services described in this documentation. All medical record entries made by the scribe were at my direction and in my presence.  I have reviewed the chart and discharge instructions and agree that the record reflects my personal performance and is accurate and complete. Keoni Risinger.  09/11/2015. 8:04 PM.      Fayrene HelperBowie Kristyna Bradstreet, PA-C 09/11/15 2004  Mancel BaleElliott Wentz, MD 09/11/15 2351

## 2015-09-11 NOTE — ED Notes (Signed)
Reports he fell down the staircase about 2 days ago. Is on blood thinners and has swelling to the left side of his face. Has been spitting up blood. Unable to open jaw, has been eating apple sauce.

## 2015-09-11 NOTE — H&P (Signed)
[name] [name] is an 44 y.o. male.   Chief Complaint: Mandible and nose fracture HPI: 44 year old male fell down stairs a couple of days ago.  Left jaw pain has worsened each day since then and he presented to the ER where CT imaging demonstrates a slightly displaced left vertical ramus and bilateral nasal bone fractures.  He complains of slight malocclusion on the left and pain with jaw movement.  He was not aware of the nasal fracture.  Past Medical History  Diagnosis Date  . H/O blood clots   . Stroke [facility]([name] 2010    Past Surgical History  Procedure Laterality Date  . Leg surgery      History reviewed. No pertinent family history. Social History:  reports that he has never smoked. He does not have any smokeless tobacco history on file. He reports that he drinks alcohol. He reports that he does not use illicit drugs.  Allergies: Multiple    (Not in a hospital admission)  No results found for this or any previous visit (from the past 48 hour(s)). Dg Cervical Spine Complete  09/11/2015  CLINICAL DATA:  Fall down stairs two days ago with persistent neck pain, initial encounter EXAM: CERVICAL SPINE - COMPLETE 4+ VIEW COMPARISON:  None. FINDINGS: There is no evidence of cervical spine fracture or prevertebral soft tissue swelling. Alignment is normal. No other significant bone abnormalities are identified. IMPRESSION: No acute abnormality noted. Electronically Signed   By: Alcide CleverMark  Lukens M.D.   On: 09/11/2015 18:51   Ct Maxillofacial Wo Cm  09/11/2015  CLINICAL DATA:  Fall down steps, left facial injury, toe pain/swelling EXAM: CT MAXILLOFACIAL WITHOUT CONTRAST TECHNIQUE: Multidetector CT imaging of the maxillofacial structures was performed. Multiplanar CT image reconstructions were also generated. A small metallic BB was placed on the right temple in order to reliably differentiate right from left. [facility] dated 09/09/2015 FINDINGS: Nondisplaced left  mandibular ramus fracture (series 9/ image 69), just beneath the mandibular condyle, unchanged. Mandible is otherwise intact. Bilateral mandibular condyles are well-seated in the TMJs. Stable bilateral nasal bone fractures with minimal depression (series 3/ image 56), unchanged. Anterior nasal septum is mildly deviated to the left (series 3/image 7), without definite fracture. No nasal septal hematoma. No additional fracture is seen. Bilateral orbits, including the globes and retroconal soft tissues, are within normal limits. Partial opacification of the bilateral maxillary and ethmoid sinuses. Mastoid air cells are clear. Visualized brain parenchyma is unremarkable. Mildly prominent bilateral cervical lymph nodes, including a 12 mm short axis ovoid left submandibular node with preservation of the normal fatty hilum (series 2/ image 13), unchanged, likely reactive. Cervical spine is intact to C5-6. IMPRESSION: Nondisplaced left mandibular ramus fracture, unchanged. Mandible is otherwise intact. Bilateral mandibular condyles are well-seated in the TMJs. Bilateral nasal bone fractures with minimal depression, unchanged. Electronically Signed   By: Charline BillsSriyesh  Krishnan M.D.   On: 09/11/2015 19:08    Review of Systems  Musculoskeletal:       Left jaw pain  All other systems reviewed and are negative.   Blood pressure 120/79, pulse 87, temperature 99 F (37.2 C), temperature source Oral, resp. rate 16, height 5\' 11"  (1.803 m), weight 76.7 kg (169 lb 1.5 oz), SpO2 99 %. Physical Exam  Constitutional: He is oriented to person, place, and time. He appears well-developed and well-nourished.  HENT:  Head: Normocephalic and atraumatic.  Right Ear: External ear normal.  Left Ear: External ear normal.  Mouth/Throat: Oropharynx  is clear and moist.  Nasal dorsum somewhat flattened.  Left angle of mandible tenderness.  Left-sided molars with slight displacement top to bottom.  Eyes: Conjunctivae and EOM are normal.  Pupils are equal, round, and reactive to light.  Neck: Normal range of motion. Neck supple.  Cardiovascular: Normal rate.   Respiratory: Effort normal.  Musculoskeletal: Normal range of motion.  Neurological: He is alert and oriented to person, place, and time. No cranial nerve deficit.  Skin: Skin is warm and dry.  Psychiatric: He has a normal mood and affect. His behavior is normal. Judgment and thought content normal.     Assessment/Plan Left vertical ramus mandible fracture, nasal fracture I personally reviewed his maxillofacial CT.  To OR for maxillomandibular fixation and closed nasal reduction.  Will observe overnight after surgery.  Sherita Decoste 09/11/2015, 9:49 PM

## 2015-09-11 NOTE — Brief Op Note (Signed)
09/11/2015  10:59 PM  PATIENT:  [name] [name]  44 y.o. male  PRE-OPERATIVE DIAGNOSIS:  LEFT MADIBULAR FRACTURE NASAL FRACTURE  POST-OPERATIVE DIAGNOSIS:  LEFT MADIBULAR FRACTURE NASAL FRACTURE  PROCEDURE:  Procedure(s): MAXILLOMANDIBULAR FIXATION (N/A) CLOSED REDUCTION NASAL FRACTURE  SURGEON:  Surgeon(s) and Role:    * Christia Readingwight Willia Lampert, MD - Primary  PHYSICIAN ASSISTANT:   ASSISTANTS: none   ANESTHESIA:   general  EBL:  Total I/O In: 500 [I.V.:500] Out: -   BLOOD ADMINISTERED:none  DRAINS: none   LOCAL MEDICATIONS USED:  LIDOCAINE   SPECIMEN:  No Specimen  DISPOSITION OF SPECIMEN:  N/A  COUNTS:  YES  TOURNIQUET:  * No tourniquets in log *  DICTATION: .Other Dictation: Dictation Number [id number]  PLAN OF CARE: Admit for overnight observation  PATIENT DISPOSITION:  PACU - hemodynamically stable.   Delay start of Pharmacological VTE agent (>24hrs) due to surgical blood loss or risk of bleeding: no

## 2015-09-11 NOTE — Transfer of Care (Signed)
Immediate Anesthesia Transfer of Care Note  Patient: [name] [name]  Procedure(s) Performed: Procedure(s): MAXILLOMANDIBULAR FIXATION (N/A) CLOSED REDUCTION NASAL FRACTURE  Patient Location: PACU  Anesthesia Type:General  Level of Consciousness: awake, alert  and oriented  Airway & Oxygen Therapy: Patient Spontanous Breathing and room air  Post-op Assessment: Report given to RN and Post -op Vital signs reviewed and stable  Post vital signs: Reviewed and stable  Last Vitals:  Filed Vitals:   09/11/15 1755  BP: 120/79  Pulse: 87  Temp: 37.2 C  Resp: 16    Complications: No apparent anesthesia complications

## 2015-09-12 MED ORDER — KCL IN DEXTROSE-NACL 20-5-0.45 MEQ/L-%-% IV SOLN
INTRAVENOUS | Status: DC
  Administered 2015-09-12: 01:00:00 via INTRAVENOUS
  Filled 2015-09-12: qty 1000

## 2015-09-12 MED ORDER — CLINDAMYCIN PALMITATE HCL 75 MG/5ML PO SOLR: 300.0000 mg | Freq: Three times a day (TID) | ORAL | Status: AC

## 2015-09-12 MED ORDER — WHITE PETROLATUM GEL
Status: AC
  Administered 2015-09-12: 01:00:00
  Filled 2015-09-12: qty 1

## 2015-09-12 MED ORDER — HYDROCODONE-ACETAMINOPHEN 7.5-325 MG/15ML PO SOLN
10.0000 mL | ORAL | Status: DC | PRN
  Administered 2015-09-12 (×2): 15 mL via ORAL
  Filled 2015-09-12 (×3): qty 15

## 2015-09-12 MED ORDER — MORPHINE SULFATE (PF) 2 MG/ML IV SOLN
2.0000 mg | INTRAVENOUS | Status: DC | PRN
  Administered 2015-09-12: 2 mg via INTRAVENOUS
  Administered 2015-09-12 (×2): 4 mg via INTRAVENOUS
  Administered 2015-09-12: 2 mg via INTRAVENOUS
  Filled 2015-09-12 (×2): qty 2
  Filled 2015-09-12 (×2): qty 1

## 2015-09-12 MED ORDER — PROMETHAZINE HCL 25 MG PO TABS: 12.5000 mg | ORAL_TABLET | Freq: Four times a day (QID) | ORAL | Status: DC | PRN

## 2015-09-12 MED ORDER — ASPIRIN EC 81 MG PO TBEC
81.0000 mg | DELAYED_RELEASE_TABLET | Freq: Every day | ORAL | Status: DC
  Administered 2015-09-12: 81 mg via ORAL
  Filled 2015-09-12: qty 1

## 2015-09-12 MED ORDER — ENOXAPARIN SODIUM 80 MG/0.8ML ~~LOC~~ SOLN: 1.0000 mg/kg | SUBCUTANEOUS | Status: DC

## 2015-09-12 MED ORDER — PROMETHAZINE HCL 25 MG RE SUPP: 12.5000 mg | Freq: Four times a day (QID) | RECTAL | Status: DC | PRN

## 2015-09-12 MED ORDER — OXYCODONE-ACETAMINOPHEN 5-325 MG/5ML PO SOLN: 7.5000 mL | ORAL | Status: DC | PRN

## 2015-09-12 NOTE — Op Note (Signed)
[name], [name]               ACCOUNT NO.:  [id number]  MEDICAL RECORD NO.:  [id number]  LOCATION:  6N26C                        FACILITY:  [facility]  PHYSICIAN:  Antony Contras, MD     DATE OF BIRTH:  [birth year]  DATE OF PROCEDURE:  09/11/2015 DATE OF DISCHARGE:                              OPERATIVE REPORT   PREOPERATIVE DIAGNOSES:  Left vertical ramus mandible fracture and nasal fracture.  POSTOPERATIVE DIAGNOSES:  Left vertical ramus mandible fracture and nasal fracture.  PROCEDURES: 1. Maxillomandibular fixation. 2. Closed nasal reduction.  SURGEON:  Antony Contras, MD.  ANESTHESIA:  General endotracheal anesthesia.  COMPLICATIONS:  None.  INDICATION:  The patient is a 44 year old male who reports that he fell down the stairs a few date days ago and has had worsening left jaw pain since then.  He came to the emergency room, and a CT scan demonstrated a slightly displaced left vertical ramus mandible fracture and nasal fractures.  He had some degree of malocclusion, and so, he presents to the operating room for fixation of his mandible as well as closing of the nasal reduction.  FINDINGS:  The teeth came into normal occlusion very easily and were secured in normal occlusion.  The nasal bones were depressed permanently on the left side and elevation was performed, but the bone was not able to stay in position.  So, a pack was placed on the left nasal bone.  DESCRIPTION OF PROCEDURE:  The patient was identified in the holding room and informed consent having been obtained, discussion of risks, benefits, alternatives, the patient was brought to the operative suite, and put on the operative table in a supine position.  Anesthesia was induced.  The patient was intubated by the Anesthesia team without difficulty using a nasotracheal approach.  The eyes were taped closed, and the bed was turned 90 degrees from anesthesia.  The gingivobuccal sulci were injected with 1%  lidocaine with 1:100,000 epinephrine.  The Striker Leibinger maxillomandibular fixation bars were then cut with the last segment removed from each side for the top and for the bottom, and a couple of the screw holes also removed.  The bars were then placed in their proper position and secured; with a size 8 self drilling screws then used to secure the plates with 4 screws in each of the upper and in then the lower plates.  The screws were placed between tooth roots.  The teeth were then brought into normal occlusion and the tongue freed in that position.  A 22-gauge wires were then wrapped around the top to bottom bars in 4 positions and tightened down.  The wires were trimmed and sucked into place.  At this point, the nasal passages were packed on both sides with Afrin pledgets for few minutes which were then removed. A Goldman elevator was then inserted to reposition the nasal bones by bimanual manipulation and working over the endotracheal tube in the right nasal passage.  The left-sided nasal bone did not stay in place. So, a Kennedy pack was placed underneath that bone, coated first in bacitracin ointment and then saturated with saline.  The nose was then coated with benzoin and a  custom cut Steri-Strips were placed, followed by a thermoplastic splint that was cut to fit the nose and then placed and hovered until malleable.  It was allowed to stiffen in place over the nose.  At this point, the nasal passages were suctioned and the nasogastric tube was passed into the pharynx to suction the pharynx.  A nasal trumpet was placed in left nasal passage by the Anesthesia's request.  He was then turned back to the Anesthesia for wake up.  He was extubated, moved to the recovery room in stable condition.     Antony Contraswight D Sianne Tejada, MD     DDB/MEDQ  D:  09/11/2015  T:  09/12/2015  Job:  [id number]

## 2015-09-12 NOTE — Discharge Summary (Signed)
Physician Discharge Summary  Patient ID: [name] [name] MRN: [id number] DOB/AGE: [birth year] 44 y.o.  Admit date: 09/11/2015 Discharge date: 09/12/2015  Admission Diagnoses: Left vertical ramus mandible fracture, nasal fracture  Discharge Diagnoses:  Active Problems:   Fracture of ramus of mandible with routine healing Nasal fracture  Discharged Condition: good  Hospital Course: 44 year old male fell down stairs a few days prior to admission.  He noticed worsening left jaw pain since then and came to the ER where a CT scan demonstrated a slightly displaced left vertical ramus fracture and nasal fracture.  He was taken to operating room for maxillomandibular fixation and closed nasal reduction.  See operative note.  He was observed overnight and did fine with pain in his throat and mouth and poor sleep.  He feels teeth are normally aligned.  He is stable for discharge.  Consults: None  Significant Diagnostic Studies: None  Treatments: surgery: Maxillomandibular fixation, closed nasal reduction  Discharge Exam: Blood pressure 159/86, pulse 69, temperature 98.8 F (37.1 C), temperature source Axillary, resp. rate 18, height 5\' 11"  (1.803 m), weight 83.643 kg (184 lb 6.4 oz), SpO2 96 %. General appearance: alert, cooperative and no distress Nose: Nasal splint in place, no nose bleeding, pack in left nasal passage. Throat: Teeth in tight occlusion with arch bars and wires in good position.  Disposition: 01-Home or Self Care  Discharge Instructions    Diet - low sodium heart healthy    Complete by:  As directed      Discharge instructions    Complete by:  As directed   Keep wire cutters with you at all times.  Cut wires if you need to vomit or have breathing problems.  Call Dr. Jenne PaneBates immediately if this becomes necessary. Diet is anything that can go through your teeth.  Supplement with Boost or Ensure shakes. For pain control, alternate every three hours with liquid Tylenol (160 mg  per 5 ml) 6 teaspoons and liquid Motrin (100 mg per 5 ml) 6 teaspoons.  Each medicine is dosed every 6 hours but you can alternate them every three hours.     Increase activity slowly    Complete by:  As directed             Medication List    STOP taking these medications        HYDROcodone-acetaminophen 5-325 MG tablet  Commonly known as:  NORCO/VICODIN     oxyCODONE-acetaminophen 5-325 MG tablet  Commonly known as:  PERCOCET/ROXICET     oxyCODONE-acetaminophen 7.5-325 MG tablet  Commonly known as:  PERCOCET      TAKE these medications        aspirin EC 81 MG tablet  Take 81 mg by mouth daily.     clindamycin 75 MG/5ML solution  Commonly known as:  CLEOCIN  Take 20 mLs (300 mg total) by mouth every 8 (eight) hours.     sulfamethoxazole-trimethoprim 800-160 MG tablet  Commonly known as:  SEPTRA DS  Take 1 tablet by mouth every 12 (twelve) hours.           Follow-up Information    Follow up with [name] Rayle, MD. Schedule an appointment as soon as possible for a visit on 09/15/2015.   Specialty:  Otolaryngology   Contact information:   [address] [place] [address] [phone]       Signed: Christia [name], [name] [name] 09/12/2015, 9:24 AM

## 2015-09-13 NOTE — Anesthesia Postprocedure Evaluation (Signed)
  Anesthesia Post-op Note  Patient: [name] [name]  Procedure(s) Performed: Procedure(s): MAXILLOMANDIBULAR FIXATION (N/A) CLOSED REDUCTION NASAL FRACTURE  Patient Location: PACU  Anesthesia Type:General  Level of Consciousness: awake and alert   Airway and Oxygen Therapy: Patient Spontanous Breathing  Post-op Pain: mild  Post-op Assessment: Post-op Vital signs reviewed              Post-op Vital Signs: Reviewed  Last Vitals:  Filed Vitals:   09/12/15 0905  BP: 159/86  Pulse: 69  Temp: 37.1 C  Resp: 18    Complications: No apparent anesthesia complications

## 2015-09-14 ENCOUNTER — Encounter (HOSPITAL_COMMUNITY): Payer: Self-pay | Admitting: Otolaryngology

## 2016-09-06 ENCOUNTER — Emergency Department (HOSPITAL_COMMUNITY)
Admission: EM | Admit: 2016-09-06 | Discharge: 2016-09-06 | Disposition: A | Discharge status: Home / Self Care | Payer: PRIVATE HEALTH INSURANCE | Attending: Emergency Medicine | Admitting: Emergency Medicine

## 2016-09-06 ENCOUNTER — Encounter (HOSPITAL_COMMUNITY): Payer: Self-pay | Admitting: Emergency Medicine

## 2016-09-06 DIAGNOSIS — Z8673 Personal history of transient ischemic attack (TIA), and cerebral infarction without residual deficits: Secondary | ICD-10-CM | POA: Insufficient documentation

## 2016-09-06 DIAGNOSIS — S0292XD Unspecified fracture of facial bones, subsequent encounter for fracture with routine healing: Secondary | ICD-10-CM | POA: Insufficient documentation

## 2016-09-06 DIAGNOSIS — Z7982 Long term (current) use of aspirin: Secondary | ICD-10-CM | POA: Insufficient documentation

## 2016-09-06 DIAGNOSIS — S0292XA Unspecified fracture of facial bones, initial encounter for closed fracture: Secondary | ICD-10-CM

## 2016-09-06 MED ORDER — ONDANSETRON 4 MG PO TBDP
4.0000 mg | ORAL_TABLET | Freq: Once | ORAL | Status: AC
  Administered 2016-09-06: 4 mg via ORAL
  Filled 2016-09-06: qty 1

## 2016-09-06 MED ORDER — HYDROMORPHONE HCL 1 MG/ML IJ SOLN
1.0000 mg | Freq: Once | INTRAMUSCULAR | Status: AC
  Administered 2016-09-06: 1 mg via INTRAMUSCULAR
  Filled 2016-09-06: qty 1
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
## 2016-09-06 NOTE — ED Notes (Signed)
We got [facility] pt aware, pa reviewing
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
## 2016-09-06 NOTE — ED Notes (Signed)
Pt signed consent for med records [facility]
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
## 2016-09-22 ENCOUNTER — Encounter (HOSPITAL_COMMUNITY): Payer: Self-pay | Admitting: *Deleted

## 2016-09-22 ENCOUNTER — Emergency Department (HOSPITAL_COMMUNITY)
Admission: EM | Admit: 2016-09-22 | Discharge: 2016-09-23 | Disposition: A | Discharge status: Home / Self Care | Payer: Self-pay | Attending: Emergency Medicine | Admitting: Emergency Medicine

## 2016-09-22 ENCOUNTER — Emergency Department (HOSPITAL_COMMUNITY): Payer: Self-pay

## 2016-09-22 DIAGNOSIS — Z7982 Long term (current) use of aspirin: Secondary | ICD-10-CM | POA: Insufficient documentation

## 2016-09-22 DIAGNOSIS — I951 Orthostatic hypotension: Secondary | ICD-10-CM | POA: Insufficient documentation

## 2016-09-22 DIAGNOSIS — Z7901 Long term (current) use of anticoagulants: Secondary | ICD-10-CM | POA: Insufficient documentation

## 2016-09-22 DIAGNOSIS — M79651 Pain in right thigh: Secondary | ICD-10-CM | POA: Insufficient documentation

## 2016-09-22 DIAGNOSIS — Z8673 Personal history of transient ischemic attack (TIA), and cerebral infarction without residual deficits: Secondary | ICD-10-CM | POA: Insufficient documentation

## 2016-09-22 LAB — CBC WITH DIFFERENTIAL/PLATELET
BASOS ABS: 0 10*3/uL (ref 0.0–0.1)
Basophils Relative: 0 %
EOS PCT: 3 %
Eosinophils Absolute: 0.2 10*3/uL (ref 0.0–0.7)
HEMATOCRIT: 39 % (ref 39.0–52.0)
HEMOGLOBIN: 12.9 g/dL — AB (ref 13.0–17.0)
LYMPHS ABS: 2.7 10*3/uL (ref 0.7–4.0)
LYMPHS PCT: 38 %
MCH: 32.3 pg (ref 26.0–34.0)
MCHC: 33.1 g/dL (ref 30.0–36.0)
MCV: 97.7 fL (ref 78.0–100.0)
Monocytes Absolute: 0.3 10*3/uL (ref 0.1–1.0)
Monocytes Relative: 4 %
NEUTROS ABS: 3.7 10*3/uL (ref 1.7–7.7)
Neutrophils Relative %: 55 %
PLATELETS: 300 10*3/uL (ref 150–400)
RBC: 3.99 MIL/uL — AB (ref 4.22–5.81)
RDW: 14.6 % (ref 11.5–15.5)
WBC: 6.9 10*3/uL (ref 4.0–10.5)

## 2016-09-22 LAB — BASIC METABOLIC PANEL
ANION GAP: 6 (ref 5–15)
BUN: 17 mg/dL (ref 6–20)
CHLORIDE: 106 mmol/L (ref 101–111)
CO2: 27 mmol/L (ref 22–32)
Calcium: 9.1 mg/dL (ref 8.9–10.3)
Creatinine, Ser: 0.96 mg/dL (ref 0.61–1.24)
GFR calc Af Amer: >60 mL/min (ref >60)
GLUCOSE: 95 mg/dL (ref 65–99)
POTASSIUM: 3.9 mmol/L (ref 3.5–5.1)
Sodium: 139 mmol/L (ref 135–145)

## 2016-09-22 LAB — I-STAT CHEM 8, ED
BUN: 18 mg/dL (ref 6–20)
CHLORIDE: 104 mmol/L (ref 101–111)
Calcium, Ion: 1.2 mmol/L (ref 1.15–1.40)
Creatinine, Ser: 1 mg/dL (ref 0.61–1.24)
GLUCOSE: 90 mg/dL (ref 65–99)
HEMATOCRIT: 40 % (ref 39.0–52.0)
HEMOGLOBIN: 13.6 g/dL (ref 13.0–17.0)
POTASSIUM: 4 mmol/L (ref 3.5–5.1)
Sodium: 141 mmol/L (ref 135–145)
TCO2: 29 mmol/L (ref 0–100)

## 2016-09-22 LAB — PROTIME-INR
INR: 0.94
Prothrombin Time: 12.6 seconds (ref 11.4–15.2)

## 2016-09-22 MED ORDER — SODIUM CHLORIDE 0.9 % IV BOLUS (SEPSIS)
1000.0000 mL | Freq: Once | INTRAVENOUS | Status: AC
  Administered 2016-09-22: 1000 mL via INTRAVENOUS

## 2016-09-22 MED ORDER — IOPAMIDOL (ISOVUE-370) INJECTION 76%
100.0000 mL | Freq: Once | INTRAVENOUS | Status: AC | PRN
  Administered 2016-09-22: 100 mL via INTRAVENOUS

## 2016-09-22 MED ORDER — OXYCODONE-ACETAMINOPHEN 5-325 MG PO TABS
1.0000 | ORAL_TABLET | Freq: Once | ORAL | Status: AC
  Administered 2016-09-22: 1 via ORAL
  Filled 2016-09-22: qty 1

## 2016-09-22 NOTE — ED Notes (Signed)
MD at bedside. 

## 2016-09-22 NOTE — ED Triage Notes (Signed)
Per EMS, pt complains of upper right leg pain since last night, swelling since this morning. Pt has hx of DVT. Pt went off anticoagulants 2 weeks ago due to upcoming surgery.

## 2016-09-22 NOTE — ED Notes (Signed)
PA at bedside.
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
## 2016-09-22 NOTE — ED Notes (Signed)
RN will draw blood work 

## 2016-09-23 ENCOUNTER — Ambulatory Visit (HOSPITAL_COMMUNITY): Admission: RE | Admit: 2016-09-23 | Payer: Self-pay | Source: Ambulatory Visit

## 2016-09-23 MED ORDER — IBUPROFEN 600 MG PO TABS: 600.0000 mg | ORAL_TABLET | Freq: Four times a day (QID) | ORAL | 0 refills | Status: DC | PRN

## 2016-09-23 NOTE — Discharge Instructions (Signed)
Please come for you DVT study tomorrow. [facility]

## 2016-09-27 ENCOUNTER — Emergency Department (HOSPITAL_COMMUNITY)
Admission: EM | Admit: 2016-09-27 | Discharge: 2016-09-27 | Disposition: A | Discharge status: Home / Self Care | Payer: 59 | Attending: Emergency Medicine | Admitting: Emergency Medicine

## 2016-09-27 ENCOUNTER — Emergency Department (HOSPITAL_COMMUNITY): Payer: 59

## 2016-09-27 ENCOUNTER — Encounter (HOSPITAL_COMMUNITY): Payer: Self-pay | Admitting: *Deleted

## 2016-09-27 DIAGNOSIS — Z8673 Personal history of transient ischemic attack (TIA), and cerebral infarction without residual deficits: Secondary | ICD-10-CM | POA: Diagnosis not present

## 2016-09-27 DIAGNOSIS — Z7901 Long term (current) use of anticoagulants: Secondary | ICD-10-CM | POA: Diagnosis not present

## 2016-09-27 DIAGNOSIS — H539 Unspecified visual disturbance: Secondary | ICD-10-CM

## 2016-09-27 DIAGNOSIS — W108XXD Fall (on) (from) other stairs and steps, subsequent encounter: Secondary | ICD-10-CM | POA: Diagnosis not present

## 2016-09-27 DIAGNOSIS — Z7982 Long term (current) use of aspirin: Secondary | ICD-10-CM | POA: Insufficient documentation

## 2016-09-27 DIAGNOSIS — S0292XD Unspecified fracture of facial bones, subsequent encounter for fracture with routine healing: Secondary | ICD-10-CM

## 2016-09-27 DIAGNOSIS — S0219XD Other fracture of base of skull, subsequent encounter for fracture with routine healing: Secondary | ICD-10-CM | POA: Insufficient documentation

## 2016-09-27 DIAGNOSIS — S0993XD Unspecified injury of face, subsequent encounter: Secondary | ICD-10-CM | POA: Diagnosis present

## 2016-09-27 MED ORDER — HYDROCODONE-ACETAMINOPHEN 5-325 MG PO TABS
2.0000 | ORAL_TABLET | Freq: Once | ORAL | Status: AC
  Administered 2016-09-27: 2 via ORAL
  Filled 2016-09-27: qty 2

## 2016-09-27 MED ORDER — IOPAMIDOL (ISOVUE-300) INJECTION 61%
INTRAVENOUS | Status: AC
  Filled 2016-09-27: qty 100

## 2016-09-27 MED ORDER — IOPAMIDOL (ISOVUE-370) INJECTION 76%
INTRAVENOUS | Status: AC
  Administered 2016-09-27: 50 mL
  Filled 2016-09-27: qty 50

## 2016-09-27 MED ORDER — OXYCODONE-ACETAMINOPHEN 5-325 MG PO TABS: 2.0000 | ORAL_TABLET | ORAL | 0 refills | Status: DC | PRN
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
## 2016-09-27 NOTE — ED Notes (Signed)
Pt was able to ambulate in room. 

## 2016-09-27 NOTE — Discharge Instructions (Signed)
Follow up with Dr. DJudi Cong
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
## 2016-09-27 NOTE — ED Notes (Signed)
Patient transported to CT 

## 2016-10-02 ENCOUNTER — Ambulatory Visit: Payer: Self-pay | Admitting: Plastic Surgery

## 2016-10-02 DIAGNOSIS — S02401A Maxillary fracture, unspecified, initial encounter for closed fracture: Secondary | ICD-10-CM
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
## 2016-10-04 ENCOUNTER — Encounter (HOSPITAL_COMMUNITY): Payer: Self-pay

## 2016-10-04 ENCOUNTER — Encounter (HOSPITAL_COMMUNITY)
Admission: RE | Admit: 2016-10-04 | Discharge: 2016-10-04 | Disposition: A | Discharge status: Home / Self Care | Payer: 59 | Source: Ambulatory Visit | Attending: Plastic Surgery | Admitting: Plastic Surgery

## 2016-10-04 DIAGNOSIS — S022XXA Fracture of nasal bones, initial encounter for closed fracture: Secondary | ICD-10-CM | POA: Diagnosis not present

## 2016-10-04 DIAGNOSIS — Z8673 Personal history of transient ischemic attack (TIA), and cerebral infarction without residual deficits: Secondary | ICD-10-CM | POA: Diagnosis not present

## 2016-10-04 DIAGNOSIS — Z86718 Personal history of other venous thrombosis and embolism: Secondary | ICD-10-CM | POA: Diagnosis not present

## 2016-10-04 DIAGNOSIS — Z01812 Encounter for preprocedural laboratory examination: Secondary | ICD-10-CM | POA: Insufficient documentation

## 2016-10-04 DIAGNOSIS — F419 Anxiety disorder, unspecified: Secondary | ICD-10-CM | POA: Diagnosis not present

## 2016-10-04 DIAGNOSIS — X58XXXA Exposure to other specified factors, initial encounter: Secondary | ICD-10-CM | POA: Insufficient documentation

## 2016-10-04 DIAGNOSIS — S02609A Fracture of mandible, unspecified, initial encounter for closed fracture: Secondary | ICD-10-CM | POA: Diagnosis present

## 2016-10-04 DIAGNOSIS — Z01810 Encounter for preprocedural cardiovascular examination: Secondary | ICD-10-CM

## 2016-10-04 DIAGNOSIS — Y939 Activity, unspecified: Secondary | ICD-10-CM | POA: Diagnosis not present

## 2016-10-04 DIAGNOSIS — G4730 Sleep apnea, unspecified: Secondary | ICD-10-CM | POA: Diagnosis not present

## 2016-10-04 DIAGNOSIS — Z880 Allergy status to penicillin: Secondary | ICD-10-CM | POA: Diagnosis not present

## 2016-10-04 DIAGNOSIS — S02411D LeFort I fracture, subsequent encounter for fracture with routine healing: Secondary | ICD-10-CM | POA: Diagnosis not present

## 2016-10-04 DIAGNOSIS — R51 Headache: Secondary | ICD-10-CM | POA: Diagnosis not present

## 2016-10-04 DIAGNOSIS — S02640A Fracture of ramus of mandible, unspecified side, initial encounter for closed fracture: Secondary | ICD-10-CM | POA: Diagnosis not present

## 2016-10-04 HISTORY — DX: Headache: R51

## 2016-10-04 HISTORY — DX: Angina pectoris, unspecified: I20.9

## 2016-10-04 HISTORY — DX: Bradycardia, unspecified: R00.1

## 2016-10-04 HISTORY — DX: Adverse effect of unspecified anesthetic, initial encounter: T41.45XA

## 2016-10-04 HISTORY — DX: Headache, unspecified: R51.9

## 2016-10-04 HISTORY — DX: Other complications of anesthesia, initial encounter: T88.59XA

## 2016-10-04 HISTORY — DX: Sleep apnea, unspecified: G47.30

## 2016-10-04 HISTORY — DX: Anxiety disorder, unspecified: F41.9

## 2016-10-04 LAB — BASIC METABOLIC PANEL
Anion gap: 5 (ref 5–15)
BUN: 19 mg/dL (ref 6–20)
CHLORIDE: 110 mmol/L (ref 101–111)
CO2: 26 mmol/L (ref 22–32)
CREATININE: 1.08 mg/dL (ref 0.61–1.24)
Calcium: 9 mg/dL (ref 8.9–10.3)
GFR calc Af Amer: >60 mL/min (ref >60)
GFR calc non Af Amer: >60 mL/min (ref >60)
Glucose, Bld: 85 mg/dL (ref 65–99)
Potassium: 3.9 mmol/L (ref 3.5–5.1)
SODIUM: 141 mmol/L (ref 135–145)

## 2016-10-04 LAB — CBC
HCT: 39 % (ref 39.0–52.0)
HEMOGLOBIN: 12.8 g/dL — AB (ref 13.0–17.0)
MCH: 31.6 pg (ref 26.0–34.0)
MCHC: 32.8 g/dL (ref 30.0–36.0)
MCV: 96.3 fL (ref 78.0–100.0)
Platelets: 203 10*3/uL (ref 150–400)
RBC: 4.05 MIL/uL — ABNORMAL LOW (ref 4.22–5.81)
RDW: 14.3 % (ref 11.5–15.5)
WBC: 6.5 10*3/uL (ref 4.0–10.5)

## 2016-10-04 LAB — PROTIME-INR
INR: 0.92
PROTHROMBIN TIME: 12.4 s (ref 11.4–15.2)

## 2016-10-04 MED ORDER — CIPROFLOXACIN IN D5W 400 MG/200ML IV SOLN
400.0000 mg | INTRAVENOUS | Status: AC
  Administered 2016-10-05: 400 mg via INTRAVENOUS
  Filled 2016-10-04: qty 200
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
## 2016-10-04 NOTE — Pre-Procedure Instructions (Signed)
Adrick [name]  10/04/2016      [facility] [id number] - [place], [place] - [address] AT [facility] [address] [place] [id number] Phone: [phone] Fax: [phone]  [facility] [address] - [place], [place] - [id number] [place] [id number] Phone: [phone] Fax: [phone]    Your procedure is scheduled on Wednesday  10/05/16  [facility] Admitting at 930 A.M.  Call this number if you have problems the morning of surgery:  [phone]   Remember:  Do not eat food or drink liquids after midnight.  Take these medicines the morning of surgery with A SIP OF WATER    OXYCODONE IF NEEDED   (STOP IBUPROFEN/ ADVIL/ MOTRIN, ASPIRIN OR ASPIRIN PRODUCTS, HERBAL MEDICINES,  XARELTO+ LOVENOX+ ASPIRIN ON HOLD)   Do not wear jewelry, make-up or nail polish.  Do not wear lotions, powders, or perfumes, or deoderant.  Do not shave 48 hours prior to surgery.  Men may shave face and neck.  Do not bring valuables to the hospital.  [facility] is not responsible for any belongings or valuables.  Contacts, dentures or bridgework may not be worn into surgery.  Leave your suitcase in the car.  After surgery it may be brought to your room.  For patients admitted to the hospital, discharge time will be determined by your treatment team.  Patients discharged the day of surgery will not be allowed to drive home.   Name and phone number of your driver:    Special instructions:  [facility] - Preparing for Surgery  Before surgery, you can play an important role.  Because skin is not sterile, your skin needs to be as free of germs as possible.  You can reduce the number of germs on you skin by washing with CHG (chlorahexidine gluconate) soap before surgery.  CHG is an antiseptic cleaner which kills germs and bonds with the skin to continue killing germs even after washing.  Please DO NOT use if you  have an allergy to CHG or antibacterial soaps.  If your skin becomes reddened/irritated stop using the CHG and inform your nurse when you arrive at Short Stay.  Do not shave (including legs and underarms) for at least 48 hours prior to the first CHG shower.  You may shave your face.  Please follow these instructions carefully:   1.  Shower with CHG Soap the night before surgery and the                                morning of Surgery.  2.  If you choose to wash your hair, wash your hair first as usual with your       normal shampoo.  3.  After you shampoo, rinse your hair and body thoroughly to remove the                      Shampoo.  4.  Use CHG as you would any other liquid soap.  You can apply chg directly       to the skin and wash gently with scrungie or a clean washcloth.  5.  Apply the CHG Soap to your body ONLY FROM THE NECK DOWN.        Do not use on open wounds or open sores.  Avoid contact with your eyes,  ears, mouth and genitals (private parts).  Wash genitals (private parts)       with your normal soap.  6.  Wash thoroughly, paying special attention to the area where your surgery        will be performed.  7.  Thoroughly rinse your body with warm water from the neck down.  8.  DO NOT shower/wash with your normal soap after using and rinsing off       the CHG Soap.  9.  Pat yourself dry with a clean towel.            10.  Wear clean pajamas.            11.  Place clean sheets on your bed the night of your first shower and do not        sleep with pets.  Day of Surgery  Do not apply any lotions/deoderants the morning of surgery.  Please wear clean clothes to the hospital/surgery center.    Please read over the following fact sheets that you were given. Pain Booklet

## 2016-10-05 ENCOUNTER — Encounter (HOSPITAL_COMMUNITY): Payer: Self-pay | Admitting: *Deleted

## 2016-10-05 ENCOUNTER — Ambulatory Visit (HOSPITAL_COMMUNITY): Payer: 59 | Admitting: Vascular Surgery

## 2016-10-05 ENCOUNTER — Encounter (HOSPITAL_COMMUNITY): Payer: Self-pay

## 2016-10-05 ENCOUNTER — Ambulatory Visit (HOSPITAL_BASED_OUTPATIENT_CLINIC_OR_DEPARTMENT_OTHER)
Admission: RE | Admit: 2016-10-05 | Discharge: 2016-10-05 | Disposition: A | Discharge status: Home / Self Care | Payer: 59 | Source: Ambulatory Visit | Attending: Plastic Surgery | Admitting: Plastic Surgery

## 2016-10-05 ENCOUNTER — Encounter (HOSPITAL_COMMUNITY)
Admission: RE | Disposition: A | Discharge status: Home / Self Care | Payer: Self-pay | Source: Ambulatory Visit | Attending: Plastic Surgery

## 2016-10-05 DIAGNOSIS — S02411D LeFort I fracture, subsequent encounter for fracture with routine healing: Secondary | ICD-10-CM | POA: Insufficient documentation

## 2016-10-05 DIAGNOSIS — S02401A Maxillary fracture, unspecified, initial encounter for closed fracture: Secondary | ICD-10-CM

## 2016-10-05 DIAGNOSIS — G4730 Sleep apnea, unspecified: Secondary | ICD-10-CM | POA: Insufficient documentation

## 2016-10-05 DIAGNOSIS — S02640A Fracture of ramus of mandible, unspecified side, initial encounter for closed fracture: Secondary | ICD-10-CM | POA: Diagnosis not present

## 2016-10-05 DIAGNOSIS — Y939 Activity, unspecified: Secondary | ICD-10-CM | POA: Insufficient documentation

## 2016-10-05 DIAGNOSIS — F419 Anxiety disorder, unspecified: Secondary | ICD-10-CM | POA: Insufficient documentation

## 2016-10-05 DIAGNOSIS — Z880 Allergy status to penicillin: Secondary | ICD-10-CM | POA: Insufficient documentation

## 2016-10-05 DIAGNOSIS — Z86718 Personal history of other venous thrombosis and embolism: Secondary | ICD-10-CM | POA: Insufficient documentation

## 2016-10-05 DIAGNOSIS — S022XXA Fracture of nasal bones, initial encounter for closed fracture: Secondary | ICD-10-CM | POA: Insufficient documentation

## 2016-10-05 DIAGNOSIS — R51 Headache: Secondary | ICD-10-CM | POA: Insufficient documentation

## 2016-10-05 DIAGNOSIS — Z8673 Personal history of transient ischemic attack (TIA), and cerebral infarction without residual deficits: Secondary | ICD-10-CM | POA: Insufficient documentation

## 2016-10-05 HISTORY — PX: ORIF MANDIBULAR FRACTURE: SHX2127

## 2016-10-05 HISTORY — PX: CLOSED REDUCTION NASAL FRACTURE: SHX5365

## 2016-10-05 SURGERY — OPEN REDUCTION INTERNAL FIXATION (ORIF) MANDIBULAR FRACTURE
Anesthesia: General | Site: Nose

## 2016-10-05 MED ORDER — LIDOCAINE-EPINEPHRINE 1 %-1:100000 IJ SOLN
INTRAMUSCULAR | Status: AC
  Filled 2016-10-05: qty 1

## 2016-10-05 MED ORDER — ONDANSETRON HCL 4 MG/2ML IJ SOLN
INTRAMUSCULAR | Status: DC | PRN
  Administered 2016-10-05: 4 mg via INTRAVENOUS

## 2016-10-05 MED ORDER — FENTANYL CITRATE (PF) 100 MCG/2ML IJ SOLN
INTRAMUSCULAR | Status: AC
  Filled 2016-10-05: qty 4

## 2016-10-05 MED ORDER — CHLORHEXIDINE GLUCONATE CLOTH 2 % EX PADS: 6.0000 | MEDICATED_PAD | Freq: Once | CUTANEOUS | Status: DC

## 2016-10-05 MED ORDER — FENTANYL CITRATE (PF) 100 MCG/2ML IJ SOLN
INTRAMUSCULAR | Status: DC | PRN
  Administered 2016-10-05 (×2): 100 ug via INTRAVENOUS

## 2016-10-05 MED ORDER — LIDOCAINE-EPINEPHRINE 1 %-1:100000 IJ SOLN
INTRAMUSCULAR | Status: DC | PRN
  Administered 2016-10-05: 8 mL

## 2016-10-05 MED ORDER — OXYMETAZOLINE HCL 0.05 % NA SOLN
NASAL | Status: DC | PRN
  Administered 2016-10-05: 1 via TOPICAL

## 2016-10-05 MED ORDER — FENTANYL CITRATE (PF) 100 MCG/2ML IJ SOLN
INTRAMUSCULAR | Status: AC
  Filled 2016-10-05: qty 2

## 2016-10-05 MED ORDER — HYDROCODONE-ACETAMINOPHEN 7.5-325 MG/15ML PO SOLN: 10.0000 mL | ORAL | 0 refills | Status: DC | PRN

## 2016-10-05 MED ORDER — ONDANSETRON HCL 4 MG/2ML IJ SOLN
INTRAMUSCULAR | Status: AC
  Filled 2016-10-05: qty 2

## 2016-10-05 MED ORDER — PROPOFOL 10 MG/ML IV BOLUS
INTRAVENOUS | Status: DC | PRN
  Administered 2016-10-05: 200 mg via INTRAVENOUS

## 2016-10-05 MED ORDER — LACTATED RINGERS IV SOLN
INTRAVENOUS | Status: DC
  Administered 2016-10-05 (×2): via INTRAVENOUS

## 2016-10-05 MED ORDER — OXYMETAZOLINE HCL 0.05 % NA SOLN
NASAL | Status: AC
  Filled 2016-10-05: qty 15

## 2016-10-05 MED ORDER — MIDAZOLAM HCL 2 MG/2ML IJ SOLN
INTRAMUSCULAR | Status: AC
  Filled 2016-10-05: qty 2

## 2016-10-05 MED ORDER — PROPOFOL 10 MG/ML IV BOLUS
INTRAVENOUS | Status: AC
  Filled 2016-10-05: qty 20

## 2016-10-05 MED ORDER — SUCCINYLCHOLINE CHLORIDE 20 MG/ML IJ SOLN
INTRAMUSCULAR | Status: DC | PRN
  Administered 2016-10-05: 120 mg via INTRAVENOUS

## 2016-10-05 MED ORDER — MIDAZOLAM HCL 5 MG/5ML IJ SOLN
INTRAMUSCULAR | Status: DC | PRN
  Administered 2016-10-05: 2 mg via INTRAVENOUS

## 2016-10-05 MED ORDER — FENTANYL CITRATE (PF) 100 MCG/2ML IJ SOLN
25.0000 ug | INTRAMUSCULAR | Status: DC | PRN
  Administered 2016-10-05 (×2): 25 ug via INTRAVENOUS
  Administered 2016-10-05: 50 ug via INTRAVENOUS

## 2016-10-05 MED ORDER — LIDOCAINE HCL (CARDIAC) 20 MG/ML IV SOLN
INTRAVENOUS | Status: DC | PRN
  Administered 2016-10-05: 60 mg via INTRAVENOUS

## 2016-10-05 MED ORDER — LIDOCAINE 2% (20 MG/ML) 5 ML SYRINGE
INTRAMUSCULAR | Status: AC
  Filled 2016-10-05: qty 5

## 2016-10-05 MED ORDER — EPHEDRINE SULFATE-NACL 50-0.9 MG/10ML-% IV SOSY
PREFILLED_SYRINGE | INTRAVENOUS | Status: DC | PRN
  Administered 2016-10-05: 10 mg via INTRAVENOUS

## 2016-10-05 MED ORDER — 0.9 % SODIUM CHLORIDE (POUR BTL) OPTIME
TOPICAL | Status: DC | PRN
  Administered 2016-10-05: 1000 mL

## 2016-10-05 SURGICAL SUPPLY — 40 items
BLADE 10 SAFETY STRL DISP (BLADE) ×3 IMPLANT
BLADE SURG 15 STRL LF DISP TIS (BLADE) ×2 IMPLANT
BLADE SURG 15 STRL SS (BLADE) ×1
BLADE SURG ROTATE 9660 (MISCELLANEOUS) IMPLANT
CANISTER SUCTION 2500CC (MISCELLANEOUS) ×3 IMPLANT
CLEANER TIP ELECTROSURG 2X2 (MISCELLANEOUS) ×3 IMPLANT
COVER SURGICAL LIGHT HANDLE (MISCELLANEOUS) ×3 IMPLANT
CRADLE DONUT ADULT HEAD (MISCELLANEOUS) ×3 IMPLANT
ELECT COATED BLADE 2.86 ST (ELECTRODE) ×3 IMPLANT
ELECT REM PT RETURN 9FT ADLT (ELECTROSURGICAL) ×3
ELECTRODE REM PT RTRN 9FT ADLT (ELECTROSURGICAL) ×2 IMPLANT
GLOVE BIO SURGEON STRL SZ 6.5 (GLOVE) ×3 IMPLANT
GOWN STRL REUS W/ TWL LRG LVL3 (GOWN DISPOSABLE) ×4 IMPLANT
GOWN STRL REUS W/TWL LRG LVL3 (GOWN DISPOSABLE) ×2
KIT BASIN OR (CUSTOM PROCEDURE TRAY) ×3 IMPLANT
KIT ROOM TURNOVER OR (KITS) ×3 IMPLANT
KIT SPLINT NASAL DENVER SM BEI (GAUZE/BANDAGES/DRESSINGS) ×3 IMPLANT
NEEDLE PRECISIONGLIDE 27X1.5 (NEEDLE) ×3 IMPLANT
NS IRRIG 1000ML POUR BTL (IV SOLUTION) ×3 IMPLANT
PAD ARMBOARD 7.5X6 YLW CONV (MISCELLANEOUS) ×6 IMPLANT
PENCIL BUTTON HOLSTER BLD 10FT (ELECTRODE) ×3 IMPLANT
PLATE HYBRID GOLD MMF (Plate) ×2 IMPLANT
PLATE HYBRID MMF GOLD (Plate) ×4 IMPLANT
PROTECTOR CORNEAL (OPHTHALMIC RELATED) IMPLANT
SCISSORS WIRE ANG 4 3/4 DISP (INSTRUMENTS) ×3 IMPLANT
SCREW LOCK SELFDRIL 2.0X8M MMF (Screw) ×21 IMPLANT
SCREW LOCKING SELF DRILL 2.0X6 (Screw) ×6 IMPLANT
SPLINT NASAL DOYLE BI-VL (GAUZE/BANDAGES/DRESSINGS) IMPLANT
SUT MON AB 3-0 SH 27 (SUTURE) ×2
SUT MON AB 3-0 SH27 (SUTURE) ×4 IMPLANT
SUT PROLENE 6 0 PC 1 (SUTURE) IMPLANT
SUT STEEL 0 (SUTURE)
SUT STEEL 0 18XMFL TIE 17 (SUTURE) IMPLANT
SUT STEEL 1 (SUTURE) IMPLANT
SUT STEEL 2 (SUTURE) IMPLANT
SUT STEEL 4 (SUTURE) ×3 IMPLANT
SUT VICRYL 4-0 PS2 18IN ABS (SUTURE) IMPLANT
TOWEL OR 17X24 6PK STRL BLUE (TOWEL DISPOSABLE) ×3 IMPLANT
TOWEL OR 17X26 10 PK STRL BLUE (TOWEL DISPOSABLE) ×3 IMPLANT
TRAY ENT MC OR (CUSTOM PROCEDURE TRAY) ×3 IMPLANT

## 2016-10-05 NOTE — Interval H&P Note (Signed)
History and Physical Interval Note:  10/05/2016 11:41 AM  [name] [name]  has presented today for surgery, with the diagnosis of FACIAL FRACTURES  The various methods of treatment have been discussed with the patient and family. After consideration of risks, benefits and other options for treatment, the patient has consented to  Procedure(s): OPEN REDUCTION INTERNAL FIXATION (ORIF) MANDIBULAR FRACTURE AND FACIAL FRACTURES (N/A) as a surgical intervention .  The patient's history has been reviewed, patient examined, no change in status, stable for surgery.  I have reviewed the patient's chart and labs.  Questions were answered to the patient's satisfaction.     Peggye FormLAIRE S Rollin Kotowski

## 2016-10-05 NOTE — Anesthesia Preprocedure Evaluation (Addendum)
Anesthesia Evaluation  Patient identified by MRN, date of birth, ID band Patient awake    Reviewed: Allergy & Precautions, NPO status , Patient's Chart, lab work & pertinent test results  Airway Mallampati: II  TM Distance: >3 FB     Dental   Pulmonary sleep apnea ,    breath sounds clear to auscultation       Cardiovascular + angina  Rhythm:Regular Rate:Normal     Neuro/Psych  Headaches, Anxiety CVA    GI/Hepatic negative GI ROS, Neg liver ROS,   Endo/Other  negative endocrine ROS  Renal/GU negative Renal ROS     Musculoskeletal   Abdominal   Peds  Hematology   Anesthesia Other Findings   Reproductive/Obstetrics                            Anesthesia Physical Anesthesia Plan  ASA: III  Anesthesia Plan: General   Post-op Pain Management:    Induction: Intravenous  Airway Management Planned:   Additional Equipment:   Intra-op Plan:   Post-operative Plan: Extubation in OR  Informed Consent: I have reviewed the patients History and Physical, chart, labs and discussed the procedure including the risks, benefits and alternatives for the proposed anesthesia with the patient or authorized representative who has indicated his/her understanding and acceptance.   Dental advisory given  Plan Discussed with: CRNA, Anesthesiologist and Surgeon  Anesthesia Plan Comments:         Anesthesia Quick Evaluation

## 2016-10-05 NOTE — Discharge Instructions (Signed)
Liquid diet only Head of bed elevated as able

## 2016-10-05 NOTE — Brief Op Note (Signed)
10/05/2016  1:08 PM  PATIENT:  [name] [name]  45 y.o. male  PRE-OPERATIVE DIAGNOSIS:  FACIAL FRACTURES  POST-OPERATIVE DIAGNOSIS:  facial fractures  PROCEDURE:  Procedure(s): Fixation of Maxillomandibular for Mandibular fracture  (N/A) CLOSED REDUCTION NASAL FRACTURE (Bilateral)  SURGEON:  Surgeon(s) and Role:    * Alena Billslaire S Dillingham, DO - Primary  PHYSICIAN ASSISTANT: Shawn Rayburn, PA  ASSISTANTS: none   ANESTHESIA:   general  EBL:  No intake/output data recorded.  BLOOD ADMINISTERED:none  DRAINS: none   LOCAL MEDICATIONS USED:  LIDOCAINE   SPECIMEN:  No Specimen  DISPOSITION OF SPECIMEN:  N/A  COUNTS:  YES  TOURNIQUET:  * No tourniquets in log *  DICTATION: .Dragon Dictation  PLAN OF CARE: Discharge to home after PACU  PATIENT DISPOSITION:  PACU - hemodynamically stable.   Delay start of Pharmacological VTE agent (>24hrs) due to surgical blood loss or risk of bleeding: no

## 2016-10-05 NOTE — Progress Notes (Signed)
Attempted to call for requested info from fax number [phone]. Called number [phone] and was placed on hold for several mins. With no answer.

## 2016-10-05 NOTE — Op Note (Signed)
Operative Note   DATE OF OPERATION: 10/05/2016  [facility]  SURGICAL DIVISION: Plastic Surgery  PREOPERATIVE DIAGNOSES:  Mandible fracture, Closed Nasal fracture  POSTOPERATIVE DIAGNOSES:  same  PROCEDURE:  Maxillomandibular Fixation, Closed nasal fracture reduction.  SURGEON: Wayland Denislaire Sanger, DO  ASSISTANT: Shawn Rayburn, PA  ANESTHESIA:  General.   COMPLICATIONS: None.   INDICATIONS FOR PROCEDURE:  The patient, [name] [name] is a 45 y.o. male born on 05/17/1971, is here for treatment of multiple facial fractures including closed nasal fracture, mandible fracture of the ramus.  The Lefort fractures appear to be healing in good alignment. MRN: [id number]  CONSENT:  Informed consent was obtained directly from the patient. Risks, benefits and alternatives were fully discussed. Specific risks including but not limited to bleeding, infection, hematoma, seroma, scarring, pain, infection, asymmetry, wound healing problems, and need for further surgery were all discussed. The patient did have an ample opportunity to have questions answered to satisfaction.   DESCRIPTION OF PROCEDURE:  The patient was taken to the operating room. SCDs were placed. The patient's operative site was prepped and draped in a sterile fashion. A time out was performed and all information was confirmed to be correct.  Anesthesia was administered with right nasal intubation.  The maxillomandibular fixation arch bar was placed on the maxila with 6 and 8 mm screws.  The lower arch bar was placed with 6 and 8 mm screws.  The 24 gauge wires were used for the fixation.  There was good alignment of the dentition.  Attention was then turned to the nose.  Local was injected at the nasal side walls.  The speculum was used to reduce the nasal fractures.  The Denver splint was applied.  The nasopharynx was aspirated.  The patient tolerated the procedure well.  There were no complications. The patient was  allowed to wake from anesthesia and taken to the recovery room in satisfactory condition.

## 2016-10-05 NOTE — Anesthesia Procedure Notes (Signed)
Procedure Name: Intubation Date/Time: 10/05/2016 12:08 PM Performed by: Orlinda BlalockMCMILLEN, Valeriano Bain L Pre-anesthesia Checklist: Patient identified, Emergency Drugs available, Suction available and Patient being monitored Patient Re-evaluated:Patient Re-evaluated prior to inductionOxygen Delivery Method: Circle System Utilized Preoxygenation: Pre-oxygenation with 100% oxygen Intubation Type: IV induction Ventilation: Mask ventilation without difficulty Laryngoscope Size: Mac and 3 Grade View: Grade II Nasal Tubes: Nasal Rae, Nasal prep performed and Magill forceps - small, utilized Tube size: 7.5 mm Number of attempts: 1 Placement Confirmation: ETT inserted through vocal cords under direct vision,  positive ETCO2 and breath sounds checked- equal and bilateral Tube secured with: Tape Dental Injury: Teeth and Oropharynx as per pre-operative assessment  Comments: Serial dilitation with 7.5 and 8.0 nasal trumpets right nare

## 2016-10-05 NOTE — Anesthesia Postprocedure Evaluation (Signed)
Anesthesia Post Note  Patient: [name] [name]  Procedure(s) Performed: Procedure(s) (LRB): Fixation of Maxillomandibular for Mandibular fracture  (N/A) CLOSED REDUCTION NASAL FRACTURE (Bilateral)  Patient location during evaluation: PACU Anesthesia Type: General Level of consciousness: awake Pain management: pain level controlled Respiratory status: spontaneous breathing Cardiovascular status: stable Anesthetic complications: no    Last Vitals:  Vitals:   10/05/16 1400 10/05/16 1420  BP: (!) 163/98 (!) 180/96  Pulse: 63 67  Resp: 13   Temp: 36.4 C     Last Pain:  Vitals:   10/05/16 1340  TempSrc:   PainSc: 5                  EDWARDS,Lipa Knauff

## 2016-10-05 NOTE — Transfer of Care (Signed)
Immediate Anesthesia Transfer of Care Note  Patient: [name] [name]  Procedure(s) Performed: Procedure(s): Fixation of Maxillomandibular for Mandibular fracture  (N/A) CLOSED REDUCTION NASAL FRACTURE (Bilateral)  Patient Location: PACU  Anesthesia Type:General  Level of Consciousness: awake, alert , oriented and patient cooperative  Airway & Oxygen Therapy: Patient Spontanous Breathing  Post-op Assessment: Report given to RN, Post -op Vital signs reviewed and stable and Patient moving all extremities  Post vital signs: Reviewed and stable  Last Vitals:  Vitals:   10/05/16 1049 10/05/16 1323  BP: (!) 157/87 (!) 170/94  Pulse:  67  Resp:  16  Temp:  36.2 C    Last Pain:  Vitals:   10/05/16 1044  TempSrc:   PainSc: 8       Patients Stated Pain Goal: 3 (10/05/16 1044)  Complications: No apparent anesthesia complications

## 2016-10-06 ENCOUNTER — Encounter (HOSPITAL_COMMUNITY): Payer: Self-pay | Admitting: Plastic Surgery

## 2016-10-21 DIAGNOSIS — I2699 Other pulmonary embolism without acute cor pulmonale: Secondary | ICD-10-CM

## 2016-10-21 HISTORY — DX: Other pulmonary embolism without acute cor pulmonale: I26.99

## 2016-10-28 ENCOUNTER — Inpatient Hospital Stay (HOSPITAL_COMMUNITY)
Admission: EM | Admit: 2016-10-28 | Discharge: 2016-10-31 | DRG: 176 | Disposition: A | Discharge status: Home / Self Care | Payer: 59 | Attending: Internal Medicine | Admitting: Internal Medicine

## 2016-10-28 ENCOUNTER — Encounter (HOSPITAL_COMMUNITY): Payer: Self-pay | Admitting: Emergency Medicine

## 2016-10-28 DIAGNOSIS — R531 Weakness: Secondary | ICD-10-CM

## 2016-10-28 DIAGNOSIS — K121 Other forms of stomatitis: Secondary | ICD-10-CM | POA: Diagnosis present

## 2016-10-28 DIAGNOSIS — I10 Essential (primary) hypertension: Secondary | ICD-10-CM | POA: Diagnosis present

## 2016-10-28 DIAGNOSIS — R509 Fever, unspecified: Secondary | ICD-10-CM | POA: Diagnosis present

## 2016-10-28 DIAGNOSIS — Z86718 Personal history of other venous thrombosis and embolism: Secondary | ICD-10-CM

## 2016-10-28 DIAGNOSIS — R200 Anesthesia of skin: Secondary | ICD-10-CM | POA: Diagnosis present

## 2016-10-28 DIAGNOSIS — R310 Gross hematuria: Secondary | ICD-10-CM | POA: Diagnosis present

## 2016-10-28 DIAGNOSIS — R7989 Other specified abnormal findings of blood chemistry: Secondary | ICD-10-CM | POA: Diagnosis present

## 2016-10-28 DIAGNOSIS — R945 Abnormal results of liver function studies: Secondary | ICD-10-CM

## 2016-10-28 DIAGNOSIS — I2699 Other pulmonary embolism without acute cor pulmonale: Principal | ICD-10-CM | POA: Diagnosis present

## 2016-10-28 DIAGNOSIS — R1011 Right upper quadrant pain: Secondary | ICD-10-CM

## 2016-10-28 DIAGNOSIS — Z7982 Long term (current) use of aspirin: Secondary | ICD-10-CM

## 2016-10-28 DIAGNOSIS — G4730 Sleep apnea, unspecified: Secondary | ICD-10-CM | POA: Diagnosis present

## 2016-10-28 DIAGNOSIS — Z8673 Personal history of transient ischemic attack (TIA), and cerebral infarction without residual deficits: Secondary | ICD-10-CM

## 2016-10-28 DIAGNOSIS — F419 Anxiety disorder, unspecified: Secondary | ICD-10-CM | POA: Diagnosis present

## 2016-10-28 DIAGNOSIS — R202 Paresthesia of skin: Secondary | ICD-10-CM

## 2016-10-28 DIAGNOSIS — I82409 Acute embolism and thrombosis of unspecified deep veins of unspecified lower extremity: Secondary | ICD-10-CM | POA: Diagnosis present

## 2016-10-28 DIAGNOSIS — K760 Fatty (change of) liver, not elsewhere classified: Secondary | ICD-10-CM | POA: Diagnosis present

## 2016-10-28 DIAGNOSIS — R079 Chest pain, unspecified: Secondary | ICD-10-CM | POA: Diagnosis not present

## 2016-10-28 DIAGNOSIS — Z79899 Other long term (current) drug therapy: Secondary | ICD-10-CM

## 2016-10-28 HISTORY — DX: Essential (primary) hypertension: I10

## 2016-10-28 LAB — COMPREHENSIVE METABOLIC PANEL
ALBUMIN: 4.4 g/dL (ref 3.5–5.0)
ALT: 93 U/L — ABNORMAL HIGH (ref 17–63)
ANION GAP: 15 (ref 5–15)
AST: 100 U/L — AB (ref 15–41)
Alkaline Phosphatase: 68 U/L (ref 38–126)
BUN: 13 mg/dL (ref 6–20)
CHLORIDE: 94 mmol/L — AB (ref 101–111)
CO2: 27 mmol/L (ref 22–32)
Calcium: 9.7 mg/dL (ref 8.9–10.3)
Creatinine, Ser: 1.28 mg/dL — ABNORMAL HIGH (ref 0.61–1.24)
GFR calc Af Amer: >60 mL/min (ref >60)
GFR calc non Af Amer: >60 mL/min (ref >60)
GLUCOSE: 115 mg/dL — AB (ref 65–99)
POTASSIUM: 3.8 mmol/L (ref 3.5–5.1)
Sodium: 136 mmol/L (ref 135–145)
TOTAL PROTEIN: 7.9 g/dL (ref 6.5–8.1)
Total Bilirubin: 1.4 mg/dL — ABNORMAL HIGH (ref 0.3–1.2)

## 2016-10-28 LAB — URINALYSIS, ROUTINE W REFLEX MICROSCOPIC
GLUCOSE, UA: NEGATIVE mg/dL
HGB URINE DIPSTICK: NEGATIVE
KETONES UR: 20 mg/dL — AB
LEUKOCYTES UA: NEGATIVE
NITRITE: NEGATIVE
PROTEIN: 30 mg/dL — AB
Specific Gravity, Urine: 1.027 (ref 1.005–1.030)
pH: 5 (ref 5.0–8.0)

## 2016-10-28 LAB — SAMPLE TO BLOOD BANK

## 2016-10-28 LAB — CBC WITH DIFFERENTIAL/PLATELET
BASOS ABS: 0 10*3/uL (ref 0.0–0.1)
BASOS PCT: 0 %
Eosinophils Absolute: 0.1 10*3/uL (ref 0.0–0.7)
Eosinophils Relative: 2 %
HCT: 42.3 % (ref 39.0–52.0)
Hemoglobin: 14.5 g/dL (ref 13.0–17.0)
Lymphocytes Relative: 27 %
Lymphs Abs: 2.2 10*3/uL (ref 0.7–4.0)
MCH: 33 pg (ref 26.0–34.0)
MCHC: 34.3 g/dL (ref 30.0–36.0)
MCV: 96.1 fL (ref 78.0–100.0)
MONO ABS: 0.4 10*3/uL (ref 0.1–1.0)
MONOS PCT: 5 %
Neutro Abs: 5.3 10*3/uL (ref 1.7–7.7)
Neutrophils Relative %: 66 %
PLATELETS: 184 10*3/uL (ref 150–400)
RBC: 4.4 MIL/uL (ref 4.22–5.81)
RDW: 13.3 % (ref 11.5–15.5)
WBC: 8 10*3/uL (ref 4.0–10.5)

## 2016-10-28 MED ORDER — SODIUM CHLORIDE 0.9 % IV BOLUS (SEPSIS)
1000.0000 mL | Freq: Once | INTRAVENOUS | Status: AC
  Administered 2016-10-29: 1000 mL via INTRAVENOUS
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
## 2016-10-28 NOTE — ED Triage Notes (Signed)
Patient reports hematuria and hemoptysis onset today , denies injury , he is taking Xarelto , respirations unlabored / alert and oriented .

## 2016-10-28 NOTE — ED Notes (Addendum)
Pt states he is having chest pains for 2 days, blood in his urine for 3 days, coughing up blood since this morning, and having swelling from reconstructive surgery to his face 4 weeks prior. Pt has his jaw wired and was advised by his surgeon to come into the ED for evaluation.

## 2016-10-29 ENCOUNTER — Inpatient Hospital Stay (HOSPITAL_COMMUNITY): Payer: 59

## 2016-10-29 ENCOUNTER — Encounter (HOSPITAL_COMMUNITY): Payer: Self-pay | Admitting: Radiology

## 2016-10-29 ENCOUNTER — Emergency Department (HOSPITAL_COMMUNITY): Payer: 59

## 2016-10-29 DIAGNOSIS — R531 Weakness: Secondary | ICD-10-CM | POA: Diagnosis not present

## 2016-10-29 DIAGNOSIS — K760 Fatty (change of) liver, not elsewhere classified: Secondary | ICD-10-CM | POA: Diagnosis present

## 2016-10-29 DIAGNOSIS — F419 Anxiety disorder, unspecified: Secondary | ICD-10-CM | POA: Diagnosis present

## 2016-10-29 DIAGNOSIS — K121 Other forms of stomatitis: Secondary | ICD-10-CM | POA: Diagnosis present

## 2016-10-29 DIAGNOSIS — I82409 Acute embolism and thrombosis of unspecified deep veins of unspecified lower extremity: Secondary | ICD-10-CM | POA: Diagnosis not present

## 2016-10-29 DIAGNOSIS — Z79899 Other long term (current) drug therapy: Secondary | ICD-10-CM | POA: Diagnosis not present

## 2016-10-29 DIAGNOSIS — R200 Anesthesia of skin: Secondary | ICD-10-CM | POA: Diagnosis present

## 2016-10-29 DIAGNOSIS — R509 Fever, unspecified: Secondary | ICD-10-CM

## 2016-10-29 DIAGNOSIS — R945 Abnormal results of liver function studies: Secondary | ICD-10-CM

## 2016-10-29 DIAGNOSIS — Z7982 Long term (current) use of aspirin: Secondary | ICD-10-CM | POA: Diagnosis not present

## 2016-10-29 DIAGNOSIS — R079 Chest pain, unspecified: Secondary | ICD-10-CM | POA: Diagnosis present

## 2016-10-29 DIAGNOSIS — Z8673 Personal history of transient ischemic attack (TIA), and cerebral infarction without residual deficits: Secondary | ICD-10-CM | POA: Diagnosis not present

## 2016-10-29 DIAGNOSIS — G459 Transient cerebral ischemic attack, unspecified: Secondary | ICD-10-CM

## 2016-10-29 DIAGNOSIS — R7989 Other specified abnormal findings of blood chemistry: Secondary | ICD-10-CM | POA: Diagnosis not present

## 2016-10-29 DIAGNOSIS — I2699 Other pulmonary embolism without acute cor pulmonale: Secondary | ICD-10-CM | POA: Diagnosis present

## 2016-10-29 DIAGNOSIS — I10 Essential (primary) hypertension: Secondary | ICD-10-CM | POA: Diagnosis present

## 2016-10-29 DIAGNOSIS — G4730 Sleep apnea, unspecified: Secondary | ICD-10-CM | POA: Diagnosis present

## 2016-10-29 DIAGNOSIS — R310 Gross hematuria: Secondary | ICD-10-CM | POA: Diagnosis present

## 2016-10-29 DIAGNOSIS — Z86718 Personal history of other venous thrombosis and embolism: Secondary | ICD-10-CM | POA: Diagnosis not present

## 2016-10-29 LAB — COMPREHENSIVE METABOLIC PANEL
ALK PHOS: 60 U/L (ref 38–126)
ALT: 99 U/L — ABNORMAL HIGH (ref 17–63)
AST: 102 U/L — AB (ref 15–41)
Albumin: 3.7 g/dL (ref 3.5–5.0)
Anion gap: 10 (ref 5–15)
BILIRUBIN TOTAL: 1.6 mg/dL — AB (ref 0.3–1.2)
BUN: 12 mg/dL (ref 6–20)
CALCIUM: 9.2 mg/dL (ref 8.9–10.3)
CO2: 31 mmol/L (ref 22–32)
Chloride: 96 mmol/L — ABNORMAL LOW (ref 101–111)
Creatinine, Ser: 1.09 mg/dL (ref 0.61–1.24)
GFR calc Af Amer: >60 mL/min (ref >60)
Glucose, Bld: 87 mg/dL (ref 65–99)
POTASSIUM: 4 mmol/L (ref 3.5–5.1)
Sodium: 137 mmol/L (ref 135–145)
TOTAL PROTEIN: 7.4 g/dL (ref 6.5–8.1)

## 2016-10-29 LAB — ETHANOL

## 2016-10-29 LAB — HEPARIN LEVEL (UNFRACTIONATED): HEPARIN UNFRACTIONATED: 0.31 [IU]/mL (ref 0.30–0.70)

## 2016-10-29 LAB — ECHOCARDIOGRAM COMPLETE
HEIGHTINCHES: 71 in
WEIGHTICAEL: 2617.6 [oz_av]

## 2016-10-29 MED ORDER — ENSURE ENLIVE PO LIQD: 237.0000 mL | Freq: Three times a day (TID) | ORAL | Status: DC | PRN

## 2016-10-29 MED ORDER — HYDROMORPHONE HCL 1 MG/ML IJ SOLN
0.5000 mg | INTRAMUSCULAR | Status: AC | PRN
  Administered 2016-10-29 (×3): 0.5 mg via INTRAVENOUS
  Filled 2016-10-29 (×3): qty 1

## 2016-10-29 MED ORDER — IOPAMIDOL (ISOVUE-370) INJECTION 76%
INTRAVENOUS | Status: AC
  Filled 2016-10-29: qty 100

## 2016-10-29 MED ORDER — ONDANSETRON HCL 4 MG/2ML IJ SOLN: 4.0000 mg | Freq: Four times a day (QID) | INTRAMUSCULAR | Status: DC | PRN

## 2016-10-29 MED ORDER — DIPHENHYDRAMINE HCL 12.5 MG/5ML PO ELIX
25.0000 mg | ORAL_SOLUTION | Freq: Four times a day (QID) | ORAL | Status: DC | PRN
  Administered 2016-10-29 – 2016-10-30 (×2): 25 mg via ORAL
  Filled 2016-10-29 (×2): qty 10

## 2016-10-29 MED ORDER — IOPAMIDOL (ISOVUE-370) INJECTION 76%
100.0000 mL | Freq: Once | INTRAVENOUS | Status: AC | PRN
  Administered 2016-10-29: 80 mL via INTRAVENOUS

## 2016-10-29 MED ORDER — HEPARIN (PORCINE) IN NACL 100-0.45 UNIT/ML-% IJ SOLN
1000.0000 [IU]/h | INTRAMUSCULAR | Status: DC
  Administered 2016-10-29 – 2016-10-30 (×2): 1000 [IU]/h via INTRAVENOUS
  Filled 2016-10-29 (×3): qty 250

## 2016-10-29 MED ORDER — ONDANSETRON HCL 4 MG PO TABS: 4.0000 mg | ORAL_TABLET | Freq: Four times a day (QID) | ORAL | Status: DC | PRN

## 2016-10-29 MED ORDER — OXYCODONE HCL 5 MG/5ML PO SOLN
5.0000 mg | Freq: Four times a day (QID) | ORAL | Status: DC | PRN
  Administered 2016-10-29 – 2016-10-30 (×4): 5 mg via ORAL
  Filled 2016-10-29 (×4): qty 5

## 2016-10-29 MED ORDER — ASPIRIN EC 81 MG PO TBEC
81.0000 mg | DELAYED_RELEASE_TABLET | Freq: Every day | ORAL | Status: DC
  Administered 2016-10-29 – 2016-10-31 (×3): 81 mg via ORAL
  Filled 2016-10-29 (×3): qty 1

## 2016-10-29 MED ORDER — CLINDAMYCIN PHOSPHATE 600 MG/50ML IV SOLN
600.0000 mg | Freq: Three times a day (TID) | INTRAVENOUS | Status: DC
  Administered 2016-10-29 – 2016-10-30 (×4): 600 mg via INTRAVENOUS
  Filled 2016-10-29 (×5): qty 50

## 2016-10-29 MED ORDER — MAGIC MOUTHWASH
15.0000 mL | Freq: Four times a day (QID) | ORAL | Status: DC
  Administered 2016-10-29 – 2016-10-31 (×7): 15 mL via ORAL
  Filled 2016-10-29 (×10): qty 15

## 2016-10-29 MED ORDER — ACETAMINOPHEN 160 MG/5ML PO SOLN
325.0000 mg | Freq: Four times a day (QID) | ORAL | Status: DC | PRN
  Filled 2016-10-29: qty 10.2

## 2016-10-29 MED ORDER — WHITE PETROLATUM GEL
Status: DC | PRN
  Administered 2016-10-29: 12:00:00 via TOPICAL
  Filled 2016-10-29: qty 28.35
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
## 2016-10-29 NOTE — Progress Notes (Signed)
Echocardiogram 2D Echocardiogram has been performed.  Marisue Humblelexis N Ladislaus Repsher 10/29/2016, 10:51 AM

## 2016-10-29 NOTE — Progress Notes (Signed)
VASCULAR LAB PRELIMINARY  PRELIMINARY  PRELIMINARY  PRELIMINARY  Carotid duplex completed.    Preliminary report:  1-39% ICA plaquing. Vertebral artery flow is antegrade.   Gavina Dildine, RVT 10/29/2016, 10:17 AM
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
## 2016-10-29 NOTE — Progress Notes (Signed)
ANTICOAGULATION CONSULT NOTE - Initial Consult  Pharmacy Consult for Heparin (Xarelto on hold) Indication: pulmonary embolus and DVT, history of   Patient Measurements: Height: 5\' 11"  (180.3 cm) Weight: 164 lb (74.4 kg) IBW/kg (Calculated) : 75.3  Vital Signs: Temp: 99.4 F (37.4 C) (12/09 0004) Temp Source: Rectal (12/09 0004) BP: 109/75 (12/09 0230) Pulse Rate: 71 (12/09 0230)  Labs:  Recent Labs  10/28/16 2045  HGB 14.5  HCT 42.3  PLT 184  CREATININE 1.28*    Estimated Creatinine Clearance: 76.7 mL/min (by C-G formula based on SCr of 1.28 mg/dL (H)).   Medical History: Past Medical History:  Diagnosis Date  . Anginal pain ([name]    LAST WEEK  . Anxiety   . Bradycardia   . Complication of anesthesia    WOKE UP DURING SURGERY   . H/O blood clots   . Headache   . Hypertension   . Sleep apnea    YRS AGO NO MACHINE   . Stroke [facility]([name] 2010    Assessment: History of PE/DVT on Xarelto PTA, presents to ED today with hematuria and hemoptysis, recent facial surgery around 4 weeks ago, Hgb is ok, renal function age appropriate, pt states he hasn't taken any Xarelto in the 4 weeks since surgery , will start heparin now.   Goal of Therapy:  Heparin level 0.3-0.7 units/ml aPTT 66-102 seconds Monitor platelets by anticoagulation protocol: Yes   Plan:  -Start heparin drip at 1000 units/hr -1200 HL -Daily CBC/HL -Monitor for bleeding -Trend Hgb   [name] DukeLedford, [name] [name] 10/29/2016,2:36 AM
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
## 2016-10-29 NOTE — ED Notes (Signed)
Patient transported to CT 
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
## 2016-10-30 ENCOUNTER — Inpatient Hospital Stay (HOSPITAL_COMMUNITY): Payer: 59

## 2016-10-30 DIAGNOSIS — R1011 Right upper quadrant pain: Secondary | ICD-10-CM

## 2016-10-30 DIAGNOSIS — R531 Weakness: Secondary | ICD-10-CM

## 2016-10-30 DIAGNOSIS — I2699 Other pulmonary embolism without acute cor pulmonale: Principal | ICD-10-CM

## 2016-10-30 LAB — VAS US CAROTID
LCCAPSYS: 112 cm/s
LEFT ECA DIAS: -22 cm/s
LEFT VERTEBRAL DIAS: -26 cm/s
LICADDIAS: -32 cm/s
LICADSYS: -75 cm/s
LICAPSYS: -123 cm/s
Left CCA dist dias: -29 cm/s
Left CCA dist sys: -104 cm/s
Left CCA prox dias: 21 cm/s
Left ICA prox dias: -36 cm/s
RIGHT ECA DIAS: -18 cm/s
RIGHT VERTEBRAL DIAS: -33 cm/s
Right CCA prox dias: 17 cm/s
Right CCA prox sys: 115 cm/s
Right cca dist sys: -90 cm/s

## 2016-10-30 LAB — COMPREHENSIVE METABOLIC PANEL
ALK PHOS: 59 U/L (ref 38–126)
ALT: 128 U/L — AB (ref 17–63)
ANION GAP: 11 (ref 5–15)
AST: 117 U/L — ABNORMAL HIGH (ref 15–41)
Albumin: 3.3 g/dL — ABNORMAL LOW (ref 3.5–5.0)
BILIRUBIN TOTAL: 0.5 mg/dL (ref 0.3–1.2)
BUN: 16 mg/dL (ref 6–20)
CALCIUM: 9.2 mg/dL (ref 8.9–10.3)
CO2: 30 mmol/L (ref 22–32)
Chloride: 97 mmol/L — ABNORMAL LOW (ref 101–111)
Creatinine, Ser: 1.02 mg/dL (ref 0.61–1.24)
GFR calc non Af Amer: >60 mL/min (ref >60)
Glucose, Bld: 111 mg/dL — ABNORMAL HIGH (ref 65–99)
Potassium: 4.5 mmol/L (ref 3.5–5.1)
SODIUM: 138 mmol/L (ref 135–145)
TOTAL PROTEIN: 6.4 g/dL — AB (ref 6.5–8.1)

## 2016-10-30 LAB — HEPATITIS PANEL, ACUTE
HCV Ab: <0.1 s/co ratio (ref 0.0–0.9)
HEP B C IGM: NEGATIVE
Hep A IgM: NEGATIVE
Hepatitis B Surface Ag: NEGATIVE

## 2016-10-30 LAB — HIV ANTIBODY (ROUTINE TESTING W REFLEX): HIV Screen 4th Generation wRfx: NONREACTIVE

## 2016-10-30 LAB — HEPARIN LEVEL (UNFRACTIONATED): Heparin Unfractionated: 0.54 IU/mL (ref 0.30–0.70)

## 2016-10-30 LAB — HEPATITIS C ANTIBODY: HCV Ab: 0.1 s/co ratio (ref 0.0–0.9)

## 2016-10-30 MED ORDER — RIVAROXABAN 20 MG PO TABS
20.0000 mg | ORAL_TABLET | Freq: Every day | ORAL | Status: DC
  Administered 2016-10-30: 20 mg via ORAL
  Filled 2016-10-30: qty 1

## 2016-10-30 MED ORDER — TECHNETIUM TC 99M MEBROFENIN IV KIT
5.2200 | PACK | Freq: Once | INTRAVENOUS | Status: AC | PRN
  Administered 2016-10-30: 5.22 via INTRAVENOUS

## 2016-10-30 MED ORDER — PNEUMOCOCCAL VAC POLYVALENT 25 MCG/0.5ML IJ INJ
0.5000 mL | INJECTION | INTRAMUSCULAR | Status: DC
  Filled 2016-10-30: qty 0.5

## 2016-10-30 NOTE — Discharge Summary (Signed)
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 size without focal abnormality. Adrenals/Urinary Tract: Adrenal glands are unremarkable. Kidneys are normal, without renal calculi, focal lesion, or hydronephrosis. Bladder is unremarkable. Stomach/Bowel: Stomach is within normal limits. Appendix is normal. No evidence of bowel wall thickening, distention, or inflammatory changes. Vascular/Lymphatic: No significant vascular findings are present. No enlarged abdominal or pelvic lymph nodes. Reproductive: Unremarkable Other: No ascites. No focal inflammatory changes in the abdomen or pelvis. Musculoskeletal: No significant skeletal lesion. Review of the MIP images confirms the above findings. IMPRESSION: 1. Positive for pulmonary emboli, predominantly to the right lower lobe. 2. Prominent right ventricle relative to the left, but this is a chronic finding without significant change compared to numerous prior chest CT examinations dating back to 2013. 3. Mild hepatic steatosis. 4. No acute findings are evident in the abdomen or pelvis. These results were called by  telephone at the time of interpretation on 10/29/2016 at 1:53 am to Dr. Jaci Carrel , who verbally acknowledged these results. Electronically Signed   By: Ellery Plunk M.D.   On: 10/29/2016 01:59   US Abdomen Limited Ruq  Result Date: 10/29/2016 CLINICAL DATA:  Elevated LFTs. EXAM: US ABDOMEN LIMITED - RIGHT UPPER QUADRANT COMPARISON:  10/29/2016, 01/16/2016 FINDINGS: Gallbladder: Within the gallbladder there is a small amount sludge. Gallbladder wall is normal in appearance. No stones or pericholecystic fluid. No sonographic Murphy's sign. Common bile duct: Diameter: 2.0 mm Liver: SFU mildly echogenic without other features of hepatic steatosis. No focal lesions are identified. IMPRESSION: 1.  No evidence for acute  abnormality. 2. Small amount of gallbladder sludge present. Electronically Signed   By: Norva Pavlov M.D.   On: 10/29/2016 14:58     Microbiology: No results found for this or any previous visit (from the past 240 hour(s)).   Labs: Basic Metabolic Panel:  Recent Labs Lab 10/28/16 2045 10/29/16 0448 10/30/16 0208 10/31/16 0647  NA 136 137 138 140  K 3.8 4.0 4.5 4.6  CL 94* 96* 97* 101  CO2 27 31 30 31   GLUCOSE 115* 87 111* 104*  BUN 13 12 16 7   CREATININE 1.28* 1.09 1.02 1.08  CALCIUM 9.7 9.2 9.2 9.3   Liver Function Tests:  Recent Labs Lab 10/28/16 2045 10/29/16 0448 10/30/16 0208 10/31/16 0647  AST 100* 102* 117* 83*  ALT 93* 99* 128* 121*  ALKPHOS 68 60 59 48  BILITOT 1.4* 1.6* 0.5 0.4  PROT 7.9 7.4 6.4* 6.5  ALBUMIN 4.4 3.7 3.3* 3.4*   No results for input(s): LIPASE, AMYLASE in the last 168 hours. No results for input(s): AMMONIA in the last 168 hours. CBC:  Recent Labs Lab 10/28/16 2045 10/31/16 0647  WBC 8.0 4.8  NEUTROABS 5.3  --   HGB 14.5 11.9*  HCT 42.3 36.7*  MCV 96.1 97.9  PLT 184 158   Cardiac Enzymes: No results for input(s): CKTOTAL, CKMB, CKMBINDEX, TROPONINI in the last 168 hours. BNP: Invalid  input(s):  POCBNP CBG: No results for input(s): GLUCAP in the last 168 hours.  Time coordinating discharge:  Greater than 30 minutes  Signed:  Avery Klingbeil, DO [facility] Pager: [phone] 10/31/2016, 11:21 AM
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
## 2016-10-30 NOTE — Progress Notes (Addendum)
ANTICOAGULATION CONSULT NOTE   Pharmacy Consult for Heparin (Xarelto on hold) Indication: pulmonary embolus and DVT, history of   Patient Measurements: Height: 5\' 11"  (180.3 cm) Weight: 163 lb 12.8 oz (74.3 kg) (scale c) IBW/kg (Calculated) : 75.3  Vital Signs: Temp: 97.3 F (36.3 C) (12/10 1312) Temp Source: Oral (12/10 1312) BP: 98/68 (12/10 1312) Pulse Rate: 64 (12/10 1312)  Labs:  Recent Labs  10/28/16 2045 10/29/16 0448 10/29/16 1215 10/30/16 0208 10/30/16 1430  HGB 14.5  --   --   --   --   HCT 42.3  --   --   --   --   PLT 184  --   --   --   --   HEPARINUNFRC  --   --  0.31  --  0.54  CREATININE 1.28* 1.09  --  1.02  --     Estimated Creatinine Clearance: 96.1 mL/min (by C-G formula based on SCr of 1.02 mg/dL).   Medical History: Past Medical History:  Diagnosis Date  . Anginal pain ([name]    LAST WEEK  . Anxiety   . Bradycardia   . Complication of anesthesia    WOKE UP DURING SURGERY   . H/O blood clots   . Headache   . Hypertension   . Sleep apnea    YRS AGO NO MACHINE   . Stroke [facility]([name] 2010    Assessment: History of PE/DVT on Xarelto PTA, presents to ED today with hematuria and hemoptysis, recent facial surgery around 4 weeks ago, Hgb is ok, renal function age appropriate, pt states he hasn't taken any Xarelto in the 4 weeks since surgery , will start heparin now.   Heparin level = 0.54 (therapeutic)  Goal of Therapy:  Heparin level 0.3-0.7 units/ml aPTT 66-102 seconds Monitor platelets by anticoagulation protocol: Yes   Plan:  -Continue heparin at 1000 units / hr -Daily CBC/HL -Monitor for bleeding -Trend Hgb   Thank you [name] [name], PharmD [phone]  [name] [name], [name] [name] 10/30/2016,3:09 PM

## 2016-10-30 NOTE — Progress Notes (Signed)
Patient spit out small light blood on the tissue paper, no clot, denies cp. MD notified. See manage order for new order. Will continue to monitor the patient.

## 2016-10-30 NOTE — Progress Notes (Signed)
Pt is allergic to Tylenol and was stating that he was taking 10mg  of the liquid oxycodone q 6 hrs at home, Thanks Lavonda JumboMike F RN

## 2016-10-31 ENCOUNTER — Other Ambulatory Visit: Payer: Self-pay | Admitting: *Deleted

## 2016-10-31 DIAGNOSIS — R200 Anesthesia of skin: Secondary | ICD-10-CM

## 2016-10-31 DIAGNOSIS — R202 Paresthesia of skin: Principal | ICD-10-CM

## 2016-10-31 LAB — CBC
HCT: 36.7 % — ABNORMAL LOW (ref 39.0–52.0)
HEMOGLOBIN: 11.9 g/dL — AB (ref 13.0–17.0)
MCH: 31.7 pg (ref 26.0–34.0)
MCHC: 32.4 g/dL (ref 30.0–36.0)
MCV: 97.9 fL (ref 78.0–100.0)
PLATELETS: 158 10*3/uL (ref 150–400)
RBC: 3.75 MIL/uL — AB (ref 4.22–5.81)
RDW: 13.5 % (ref 11.5–15.5)
WBC: 4.8 10*3/uL (ref 4.0–10.5)

## 2016-10-31 LAB — COMPREHENSIVE METABOLIC PANEL
ALK PHOS: 48 U/L (ref 38–126)
ALT: 121 U/L — AB (ref 17–63)
AST: 83 U/L — AB (ref 15–41)
Albumin: 3.4 g/dL — ABNORMAL LOW (ref 3.5–5.0)
Anion gap: 8 (ref 5–15)
BUN: 7 mg/dL (ref 6–20)
CALCIUM: 9.3 mg/dL (ref 8.9–10.3)
CHLORIDE: 101 mmol/L (ref 101–111)
CO2: 31 mmol/L (ref 22–32)
CREATININE: 1.08 mg/dL (ref 0.61–1.24)
GFR calc Af Amer: >60 mL/min (ref >60)
Glucose, Bld: 104 mg/dL — ABNORMAL HIGH (ref 65–99)
Potassium: 4.6 mmol/L (ref 3.5–5.1)
Sodium: 140 mmol/L (ref 135–145)
Total Bilirubin: 0.4 mg/dL (ref 0.3–1.2)
Total Protein: 6.5 g/dL (ref 6.5–8.1)

## 2016-10-31 LAB — MITOCHONDRIAL ANTIBODIES: MITOCHONDRIAL M2 AB, IGG: 29.7 U — AB (ref 0.0–20.0)

## 2016-10-31 LAB — ANTI-SMOOTH MUSCLE ANTIBODY, IGG: F-Actin IgG: 9 Units (ref 0–19)

## 2016-10-31 LAB — HEPATITIS B SURFACE ANTIGEN: Hepatitis B Surface Ag: NEGATIVE

## 2016-10-31 LAB — ALPHA-1-ANTITRYPSIN: A-1 Antitrypsin, Ser: 110 mg/dL (ref 90–200)

## 2016-10-31 LAB — ANTINUCLEAR ANTIBODIES, IFA: ANTINUCLEAR ANTIBODIES, IFA: NEGATIVE

## 2016-10-31 MED ORDER — MAGIC MOUTHWASH: 15.0000 mL | Freq: Four times a day (QID) | ORAL | 0 refills | Status: DC

## 2016-10-31 MED ORDER — ENSURE ENLIVE PO LIQD: 237.0000 mL | Freq: Three times a day (TID) | ORAL | 0 refills | Status: DC | PRN

## 2016-10-31 MED ORDER — RIVAROXABAN 20 MG PO TABS: 20.0000 mg | ORAL_TABLET | Freq: Every day | ORAL | 0 refills | Status: DC

## 2016-10-31 MED FILL — XARELTO 20 MG TABLET: 20 | 30 days supply | Qty: 30 | Fill #0

## 2016-10-31 NOTE — Progress Notes (Signed)
Patient discharged to home with instructions and prescriptions. 

## 2016-10-31 NOTE — Progress Notes (Signed)
CM talked to patient at the bedside; he stated that he is on Xarelto since 2014 but stopped taking the medication for 2 months. [facility]; CM asked him how much was his co pay for Xarelto, he stated that he plans to [facility] to get his [facility] filled. Abelino DerrickB Kaile Bixler Kettering Medical CenterRN,MHA,BSN [phone]

## 2016-11-02 ENCOUNTER — Inpatient Hospital Stay: Payer: PRIVATE HEALTH INSURANCE

## 2016-11-10 ENCOUNTER — Encounter (HOSPITAL_BASED_OUTPATIENT_CLINIC_OR_DEPARTMENT_OTHER): Payer: Self-pay | Admitting: *Deleted

## 2016-11-15 ENCOUNTER — Ambulatory Visit: Payer: Self-pay | Admitting: Plastic Surgery

## 2016-11-15 DIAGNOSIS — S02411D LeFort I fracture, subsequent encounter for fracture with routine healing: Secondary | ICD-10-CM
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
## 2016-11-16 ENCOUNTER — Ambulatory Visit (HOSPITAL_BASED_OUTPATIENT_CLINIC_OR_DEPARTMENT_OTHER)
Admission: RE | Admit: 2016-11-16 | Discharge: 2016-11-16 | Disposition: A | Discharge status: Home / Self Care | Payer: Self-pay | Source: Ambulatory Visit | Attending: Plastic Surgery | Admitting: Plastic Surgery

## 2016-11-16 ENCOUNTER — Ambulatory Visit (HOSPITAL_BASED_OUTPATIENT_CLINIC_OR_DEPARTMENT_OTHER): Payer: Self-pay | Admitting: Anesthesiology

## 2016-11-16 ENCOUNTER — Encounter (HOSPITAL_BASED_OUTPATIENT_CLINIC_OR_DEPARTMENT_OTHER)
Admission: RE | Disposition: A | Discharge status: Home / Self Care | Payer: Self-pay | Source: Ambulatory Visit | Attending: Plastic Surgery

## 2016-11-16 ENCOUNTER — Encounter (HOSPITAL_BASED_OUTPATIENT_CLINIC_OR_DEPARTMENT_OTHER): Payer: Self-pay | Admitting: Anesthesiology

## 2016-11-16 DIAGNOSIS — Z8673 Personal history of transient ischemic attack (TIA), and cerebral infarction without residual deficits: Secondary | ICD-10-CM | POA: Insufficient documentation

## 2016-11-16 DIAGNOSIS — F419 Anxiety disorder, unspecified: Secondary | ICD-10-CM | POA: Insufficient documentation

## 2016-11-16 DIAGNOSIS — I10 Essential (primary) hypertension: Secondary | ICD-10-CM | POA: Insufficient documentation

## 2016-11-16 DIAGNOSIS — S02411D LeFort I fracture, subsequent encounter for fracture with routine healing: Secondary | ICD-10-CM

## 2016-11-16 DIAGNOSIS — G4730 Sleep apnea, unspecified: Secondary | ICD-10-CM | POA: Insufficient documentation

## 2016-11-16 DIAGNOSIS — Z880 Allergy status to penicillin: Secondary | ICD-10-CM | POA: Insufficient documentation

## 2016-11-16 DIAGNOSIS — S02412D LeFort II fracture, subsequent encounter for fracture with routine healing: Secondary | ICD-10-CM | POA: Insufficient documentation

## 2016-11-16 DIAGNOSIS — Z86711 Personal history of pulmonary embolism: Secondary | ICD-10-CM | POA: Insufficient documentation

## 2016-11-16 DIAGNOSIS — S0240FD Zygomatic fracture, left side, subsequent encounter for fracture with routine healing: Secondary | ICD-10-CM | POA: Insufficient documentation

## 2016-11-16 HISTORY — DX: Cardiac arrhythmia, unspecified: I49.9

## 2016-11-16 HISTORY — DX: Myoneural disorder, unspecified: G70.9

## 2016-11-16 HISTORY — PX: MANDIBULAR HARDWARE REMOVAL: SHX5205

## 2016-11-16 HISTORY — DX: Other pulmonary embolism without acute cor pulmonale: I26.99

## 2016-11-16 SURGERY — REMOVAL, HARDWARE, MANDIBLE
Anesthesia: Monitor Anesthesia Care | Site: Mouth

## 2016-11-16 MED ORDER — CIPROFLOXACIN IN D5W 400 MG/200ML IV SOLN
INTRAVENOUS | Status: AC
  Filled 2016-11-16: qty 200

## 2016-11-16 MED ORDER — MIDAZOLAM HCL 2 MG/2ML IJ SOLN
1.0000 mg | INTRAMUSCULAR | Status: DC | PRN
  Administered 2016-11-16: 2 mg via INTRAVENOUS

## 2016-11-16 MED ORDER — ONDANSETRON HCL 4 MG/2ML IJ SOLN: 4.0000 mg | Freq: Once | INTRAMUSCULAR | Status: DC | PRN

## 2016-11-16 MED ORDER — CIPROFLOXACIN IN D5W 400 MG/200ML IV SOLN
400.0000 mg | INTRAVENOUS | Status: AC
  Administered 2016-11-16: 400 mg via INTRAVENOUS

## 2016-11-16 MED ORDER — BUPIVACAINE-EPINEPHRINE 0.25% -1:200000 IJ SOLN
INTRAMUSCULAR | Status: DC | PRN
  Administered 2016-11-16: .5 mL

## 2016-11-16 MED ORDER — MIDAZOLAM HCL 2 MG/2ML IJ SOLN
INTRAMUSCULAR | Status: AC
  Filled 2016-11-16: qty 2

## 2016-11-16 MED ORDER — PROPOFOL 10 MG/ML IV BOLUS
INTRAVENOUS | Status: DC | PRN
  Administered 2016-11-16 (×3): 20 mg via INTRAVENOUS

## 2016-11-16 MED ORDER — DEXAMETHASONE SODIUM PHOSPHATE 10 MG/ML IJ SOLN
INTRAMUSCULAR | Status: AC
  Filled 2016-11-16: qty 1

## 2016-11-16 MED ORDER — FENTANYL CITRATE (PF) 100 MCG/2ML IJ SOLN
50.0000 ug | INTRAMUSCULAR | Status: DC | PRN
  Administered 2016-11-16: 100 ug via INTRAVENOUS

## 2016-11-16 MED ORDER — HYDROMORPHONE HCL 1 MG/ML IJ SOLN
0.2500 mg | INTRAMUSCULAR | Status: DC | PRN
  Administered 2016-11-16 (×2): 0.5 mg via INTRAVENOUS

## 2016-11-16 MED ORDER — LACTATED RINGERS IV SOLN
INTRAVENOUS | Status: DC
  Administered 2016-11-16: 08:00:00 via INTRAVENOUS

## 2016-11-16 MED ORDER — SCOPOLAMINE 1 MG/3DAYS TD PT72: 1.0000 | MEDICATED_PATCH | Freq: Once | TRANSDERMAL | Status: DC | PRN

## 2016-11-16 MED ORDER — HYDROMORPHONE HCL 1 MG/ML IJ SOLN
INTRAMUSCULAR | Status: AC
  Filled 2016-11-16: qty 1

## 2016-11-16 MED ORDER — ONDANSETRON HCL 4 MG/2ML IJ SOLN
INTRAMUSCULAR | Status: AC
  Filled 2016-11-16: qty 2

## 2016-11-16 MED ORDER — PROPOFOL 500 MG/50ML IV EMUL
INTRAVENOUS | Status: AC
  Filled 2016-11-16: qty 50

## 2016-11-16 MED ORDER — MEPERIDINE HCL 25 MG/ML IJ SOLN: 6.2500 mg | INTRAMUSCULAR | Status: DC | PRN

## 2016-11-16 MED ORDER — FENTANYL CITRATE (PF) 100 MCG/2ML IJ SOLN
INTRAMUSCULAR | Status: AC
  Filled 2016-11-16: qty 2

## 2016-11-16 SURGICAL SUPPLY — 28 items
BLADE SURG 15 STRL LF DISP TIS (BLADE) ×1 IMPLANT
BLADE SURG 15 STRL SS (BLADE) ×1
CANISTER SUCT 1200ML W/VALVE (MISCELLANEOUS) ×2 IMPLANT
COVER MAYO STAND STRL (DRAPES) ×2 IMPLANT
DECANTER SPIKE VIAL GLASS SM (MISCELLANEOUS) ×2 IMPLANT
DERMABOND ADVANCED (GAUZE/BANDAGES/DRESSINGS)
DERMABOND ADVANCED .7 DNX12 (GAUZE/BANDAGES/DRESSINGS) IMPLANT
ELECT COATED BLADE 2.86 ST (ELECTRODE) IMPLANT
ELECT REM PT RETURN 9FT ADLT (ELECTROSURGICAL)
ELECTRODE REM PT RTRN 9FT ADLT (ELECTROSURGICAL) IMPLANT
GLOVE BIO SURGEON STRL SZ 6.5 (GLOVE) ×4 IMPLANT
GLOVE SURG SS PI 7.0 STRL IVOR (GLOVE) ×2 IMPLANT
GOWN STRL REUS W/ TWL LRG LVL3 (GOWN DISPOSABLE) ×3 IMPLANT
GOWN STRL REUS W/TWL LRG LVL3 (GOWN DISPOSABLE) ×3
MARKER SKIN DUAL TIP RULER LAB (MISCELLANEOUS) IMPLANT
NEEDLE PRECISIONGLIDE 27X1.5 (NEEDLE) ×2 IMPLANT
PACK BASIN DAY SURGERY FS (CUSTOM PROCEDURE TRAY) ×2 IMPLANT
PENCIL FOOT CONTROL (ELECTRODE) IMPLANT
SCISSORS WIRE ANG 4 3/4 DISP (INSTRUMENTS) IMPLANT
SHEET MEDIUM DRAPE 40X70 STRL (DRAPES) ×2 IMPLANT
SPONGE GAUZE 4X4 12PLY STER LF (GAUZE/BANDAGES/DRESSINGS) ×4 IMPLANT
SUT CHROMIC 3 0 PS 2 (SUTURE) IMPLANT
SUT CHROMIC 4 0 PS 2 18 (SUTURE) IMPLANT
SYR CONTROL 10ML LL (SYRINGE) ×2 IMPLANT
TOWEL OR 17X24 6PK STRL BLUE (TOWEL DISPOSABLE) ×2 IMPLANT
TRAY DSU PREP LF (CUSTOM PROCEDURE TRAY) IMPLANT
TUBE CONNECTING 20X1/4 (TUBING) ×2 IMPLANT
YANKAUER SUCT BULB TIP NO VENT (SUCTIONS) ×2 IMPLANT

## 2016-11-16 NOTE — Anesthesia Postprocedure Evaluation (Signed)
Anesthesia Post Note  Patient: [name] [name]  Procedure(s) Performed: Procedure(s) (LRB): MANDIBULAR HARDWARE REMOVAL (N/A)  Patient location during evaluation: PACU Anesthesia Type: MAC Level of consciousness: awake and alert Pain management: pain level controlled Vital Signs Assessment: post-procedure vital signs reviewed and stable Respiratory status: spontaneous breathing, nonlabored ventilation, respiratory function stable and patient connected to nasal cannula oxygen Cardiovascular status: stable and blood pressure returned to baseline Anesthetic complications: no       Last Vitals:  Vitals:   11/16/16 0919 11/16/16 1011  BP: 140/90 (!) 145/95  Pulse: 60 61  Resp: 16 18  Temp:  36.4 C    Last Pain:  Vitals:   11/16/16 1011  TempSrc: Oral  PainSc: 7                  Amita Atayde DAVID

## 2016-11-16 NOTE — Anesthesia Preprocedure Evaluation (Signed)
Anesthesia Evaluation  Patient identified by MRN, date of birth, ID band Patient awake    Reviewed: Allergy & Precautions, NPO status , Patient's Chart, lab work & pertinent test results  Airway Mallampati: I  TM Distance: >3 FB Neck ROM: Full    Dental   Pulmonary sleep apnea ,    Pulmonary exam normal        Cardiovascular hypertension, Normal cardiovascular exam     Neuro/Psych    GI/Hepatic   Endo/Other    Renal/GU      Musculoskeletal   Abdominal   Peds  Hematology   Anesthesia Other Findings   Reproductive/Obstetrics                             Anesthesia Physical Anesthesia Plan  ASA: II  Anesthesia Plan: MAC   Post-op Pain Management:    Induction: Intravenous  Airway Management Planned: Simple Face Mask  Additional Equipment:   Intra-op Plan:   Post-operative Plan:   Informed Consent: I have reviewed the patients History and Physical, chart, labs and discussed the procedure including the risks, benefits and alternatives for the proposed anesthesia with the patient or authorized representative who has indicated his/her understanding and acceptance.     Plan Discussed with: CRNA and Surgeon  Anesthesia Plan Comments:         Anesthesia Quick Evaluation
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
## 2016-11-16 NOTE — Interval H&P Note (Signed)
History and Physical Interval Note:  11/16/2016 8:12 AM  [name] [name]  has presented today for surgery, with the diagnosis of FACE FRACTURES  The various methods of treatment have been discussed with the patient and family. After consideration of risks, benefits and other options for treatment, the patient has consented to  Procedure(s): MANDIBULAR HARDWARE REMOVAL (N/A) as a surgical intervention .  The patient's history has been reviewed, patient examined, no change in status, stable for surgery.  I have reviewed the patient's chart and labs.  Questions were answered to the patient's satisfaction.     Peggye FormLAIRE S DILLINGHAM

## 2016-11-16 NOTE — Progress Notes (Signed)
0940  Pt C/O pain in R leg "from the DVT".  No additional pain when ambulating, anesthesia at bedside to assess.  Ok to be discharged per anesthesia.

## 2016-11-16 NOTE — Op Note (Signed)
Operative Note   DATE OF OPERATION: 11/16/2016  [facility]  SURGICAL DIVISION: Plastic Surgery  PREOPERATIVE DIAGNOSES:  Facial fractures  POSTOPERATIVE DIAGNOSES:  same  PROCEDURE:  Removal of Maxillomandibular hardware.  SURGEON: Wayland Denislaire Sanger, DO  ASSISTANT: Shawn Rayburn, PA  ANESTHESIA:  General.   COMPLICATIONS: None.   INDICATIONS FOR PROCEDURE:  The patient, [name] [name] is a 45 y.o. male born on [birth year], is here for treatment of multiple facial fractures. MRN: [id number]  CONSENT:  Informed consent was obtained directly from the patient. Risks, benefits and alternatives were fully discussed. Specific risks including but not limited to bleeding, infection, hematoma, seroma, scarring, pain, infection, asymmetry, wound healing problems, and need for further surgery were all discussed. The patient did have an ample opportunity to have questions answered to satisfaction.   DESCRIPTION OF PROCEDURE:  The patient was taken to the operating room. SCDs were placed. The patient's operative site was prepped and draped in a sterile fashion. A time out was performed and all information was confirmed to be correct.  Anesthesia was administered.  The maxillomandibular fixation hardware was removed completely.  There was good alignment of the dentition.  The patient tolerated the procedure well.  There were no complications. The patient was allowed to wake from anesthesia and taken to the recovery room in satisfactory condition.

## 2016-11-16 NOTE — Discharge Instructions (Signed)
°  Post Anesthesia Home Care Instructions  Activity: Get plenty of rest for the remainder of the day. A responsible adult should stay with you for 24 hours following the procedure.  For the next 24 hours, DO NOT: -Drive a car -Advertising copywriterperate machinery -Drink alcoholic beverages -Take any medication unless instructed by your physician -Make any legal decisions or sign important papers.  Meals: Start with liquid foods such as gelatin or soup. Progress to regular foods as tolerated. Avoid greasy, spicy, heavy foods. If nausea and/or vomiting occur, drink only clear liquids until the nausea and/or vomiting subsides. Call your physician if vomiting continues.  Special Instructions/Symptoms: Your throat may feel dry or sore from the anesthesia or the breathing tube placed in your throat during surgery. If this causes discomfort, gargle with warm salt water. The discomfort should disappear within 24 hours.  If you had a scopolamine patch placed behind your ear for the management of post- operative nausea and/or vomiting:  1. The medication in the patch is effective for 72 hours, after which it should be removed.  Wrap patch in a tissue and discard in the trash. Wash hands thoroughly with soap and water. 2. You may remove the patch earlier than 72 hours if you experience unpleasant side effects which may include dry mouth, dizziness or visual disturbances. 3. Avoid touching the patch. Wash your hands with soap and water after contact with the patch.   Call your surgeon if you experience:   1.  Fever over 101.0. 2.  Inability to urinate. 3.  Nausea and/or vomiting. 4.  Extreme swelling or bruising at the surgical site. 5.  Continued bleeding from the incision. 6.  Increased pain, redness or drainage from the incision. 7.  Problems related to your pain medication. 8.  Any problems and/or concerns  Soft diet for one week than can start regular diet.

## 2016-11-16 NOTE — Brief Op Note (Signed)
11/16/2016  8:31 AM  PATIENT:  [name] [name]  45 y.o. male  PRE-OPERATIVE DIAGNOSIS:  FACE FRACTURES  POST-OPERATIVE DIAGNOSIS:  FACE FRACTURES  PROCEDURE:  Procedure(s): MANDIBULAR HARDWARE REMOVAL (N/A)  SURGEON:  Surgeon(s) and Role:    * Alena Billslaire S Dillingham, DO - Primary  PHYSICIAN ASSISTANT: Shawn Rayburn, PA  ASSISTANTS: none   ANESTHESIA:   general  EBL:  No intake/output data recorded.  BLOOD ADMINISTERED:none  DRAINS: none   LOCAL MEDICATIONS USED:  NONE  SPECIMEN:  No Specimen  DISPOSITION OF SPECIMEN:  N/A  COUNTS:  YES  TOURNIQUET:  * No tourniquets in log *  DICTATION: .Dragon Dictation  PLAN OF CARE: Discharge to home after PACU  PATIENT DISPOSITION:  PACU - hemodynamically stable.   Delay start of Pharmacological VTE agent (>24hrs) due to surgical blood loss or risk of bleeding: no

## 2016-11-16 NOTE — Transfer of Care (Signed)
Immediate Anesthesia Transfer of Care Note  Patient: [name] [name]  Procedure(s) Performed: Procedure(s): MANDIBULAR HARDWARE REMOVAL (N/A)  Patient Location: PACU  Anesthesia Type:MAC  Level of Consciousness: awake, alert  and oriented  Airway & Oxygen Therapy: Patient Spontanous Breathing and Patient connected to nasal cannula oxygen  Post-op Assessment: Report given to RN and Post -op Vital signs reviewed and stable  Post vital signs: Reviewed and stable  Last Vitals:  Vitals:   11/16/16 0749 11/16/16 0850  BP: 130/82 (!) 141/97  Pulse: (!) 54 70  Resp: 20 12  Temp: 36.6 C     Last Pain:  Vitals:   11/16/16 0749  TempSrc: Oral      Patients Stated Pain Goal: 9 (11/16/16 0749)  Complications: No apparent anesthesia complications

## 2016-11-17 ENCOUNTER — Encounter (HOSPITAL_BASED_OUTPATIENT_CLINIC_OR_DEPARTMENT_OTHER): Payer: Self-pay | Admitting: Plastic Surgery

## 2016-11-17 ENCOUNTER — Encounter: Payer: 59 | Admitting: Neurology

## 2016-11-28 ENCOUNTER — Encounter: Payer: Self-pay | Admitting: Neurology

## 2016-12-05 ENCOUNTER — Ambulatory Visit: Payer: 59 | Admitting: Neurology

## 2016-12-11 ENCOUNTER — Emergency Department (HOSPITAL_COMMUNITY): Payer: BLUE CROSS/BLUE SHIELD

## 2016-12-11 ENCOUNTER — Encounter (HOSPITAL_COMMUNITY): Payer: Self-pay

## 2016-12-11 DIAGNOSIS — R200 Anesthesia of skin: Secondary | ICD-10-CM | POA: Insufficient documentation

## 2016-12-11 DIAGNOSIS — I10 Essential (primary) hypertension: Secondary | ICD-10-CM | POA: Insufficient documentation

## 2016-12-11 DIAGNOSIS — Z7901 Long term (current) use of anticoagulants: Secondary | ICD-10-CM | POA: Insufficient documentation

## 2016-12-11 DIAGNOSIS — Z7982 Long term (current) use of aspirin: Secondary | ICD-10-CM | POA: Insufficient documentation

## 2016-12-11 DIAGNOSIS — R1030 Lower abdominal pain, unspecified: Secondary | ICD-10-CM | POA: Insufficient documentation

## 2016-12-11 DIAGNOSIS — Z8673 Personal history of transient ischemic attack (TIA), and cerebral infarction without residual deficits: Secondary | ICD-10-CM | POA: Insufficient documentation

## 2016-12-11 DIAGNOSIS — I2699 Other pulmonary embolism without acute cor pulmonale: Secondary | ICD-10-CM | POA: Insufficient documentation

## 2016-12-11 LAB — CBC
HCT: 39.2 % (ref 39.0–52.0)
HEMOGLOBIN: 13.2 g/dL (ref 13.0–17.0)
MCH: 32.4 pg (ref 26.0–34.0)
MCHC: 33.7 g/dL (ref 30.0–36.0)
MCV: 96.3 fL (ref 78.0–100.0)
PLATELETS: 206 10*3/uL (ref 150–400)
RBC: 4.07 MIL/uL — AB (ref 4.22–5.81)
RDW: 14 % (ref 11.5–15.5)
WBC: 5.6 10*3/uL (ref 4.0–10.5)

## 2016-12-11 LAB — COMPREHENSIVE METABOLIC PANEL
ALT: 28 U/L (ref 17–63)
ANION GAP: 10 (ref 5–15)
AST: 37 U/L (ref 15–41)
Albumin: 3.9 g/dL (ref 3.5–5.0)
Alkaline Phosphatase: 59 U/L (ref 38–126)
BILIRUBIN TOTAL: 1.1 mg/dL (ref 0.3–1.2)
BUN: 10 mg/dL (ref 6–20)
CO2: 25 mmol/L (ref 22–32)
Calcium: 8.8 mg/dL — ABNORMAL LOW (ref 8.9–10.3)
Chloride: 101 mmol/L (ref 101–111)
Creatinine, Ser: 0.94 mg/dL (ref 0.61–1.24)
GFR calc non Af Amer: >60 mL/min (ref >60)
Glucose, Bld: 109 mg/dL — ABNORMAL HIGH (ref 65–99)
Potassium: 3.5 mmol/L (ref 3.5–5.1)
Sodium: 136 mmol/L (ref 135–145)
TOTAL PROTEIN: 6.9 g/dL (ref 6.5–8.1)

## 2016-12-11 LAB — I-STAT CHEM 8, ED
BUN: 11 mg/dL (ref 6–20)
Calcium, Ion: 1.05 mmol/L — ABNORMAL LOW (ref 1.15–1.40)
Chloride: 102 mmol/L (ref 101–111)
Creatinine, Ser: 1.1 mg/dL (ref 0.61–1.24)
Glucose, Bld: 110 mg/dL — ABNORMAL HIGH (ref 65–99)
HCT: 42 % (ref 39.0–52.0)
Hemoglobin: 14.3 g/dL (ref 13.0–17.0)
Potassium: 3.5 mmol/L (ref 3.5–5.1)
SODIUM: 139 mmol/L (ref 135–145)
TCO2: 26 mmol/L (ref 0–100)

## 2016-12-11 LAB — DIFFERENTIAL
Basophils Absolute: 0 10*3/uL (ref 0.0–0.1)
Basophils Relative: 0 %
EOS PCT: 2 %
Eosinophils Absolute: 0.1 10*3/uL (ref 0.0–0.7)
LYMPHS ABS: 2 10*3/uL (ref 0.7–4.0)
LYMPHS PCT: 36 %
MONO ABS: 0.3 10*3/uL (ref 0.1–1.0)
Monocytes Relative: 6 %
Neutro Abs: 3.2 10*3/uL (ref 1.7–7.7)
Neutrophils Relative %: 56 %

## 2016-12-11 LAB — I-STAT TROPONIN, ED: TROPONIN I, POC: 0 ng/mL (ref 0.00–0.08)

## 2016-12-11 NOTE — ED Triage Notes (Signed)
Pt complaining of L arm and facial numbness x 2 days. Pt states unable to void x 3 days. Pt states hx stroke 2 years ago. Pt states also dx with PE 1 month ago. Pt states taking xerelto. Pt states rx ran out yesterday. Pt also complaining of chest pain and SOB x 4 days.

## 2016-12-12 ENCOUNTER — Emergency Department (HOSPITAL_COMMUNITY)
Admission: EM | Admit: 2016-12-12 | Discharge: 2016-12-12 | Disposition: A | Discharge status: Home / Self Care | Payer: Self-pay | Attending: Emergency Medicine | Admitting: Emergency Medicine

## 2016-12-12 ENCOUNTER — Emergency Department (HOSPITAL_BASED_OUTPATIENT_CLINIC_OR_DEPARTMENT_OTHER)
Admit: 2016-12-12 | Discharge: 2016-12-12 | Disposition: A | Discharge status: Home / Self Care | Payer: BLUE CROSS/BLUE SHIELD

## 2016-12-12 ENCOUNTER — Emergency Department (HOSPITAL_COMMUNITY): Payer: BLUE CROSS/BLUE SHIELD

## 2016-12-12 ENCOUNTER — Emergency Department (HOSPITAL_BASED_OUTPATIENT_CLINIC_OR_DEPARTMENT_OTHER): Payer: BLUE CROSS/BLUE SHIELD

## 2016-12-12 ENCOUNTER — Ambulatory Visit (HOSPITAL_COMMUNITY)
Admission: EM | Admit: 2016-12-12 | Discharge: 2016-12-12 | Disposition: A | Discharge status: Nursing Home (Includes ICF) | Payer: BLUE CROSS/BLUE SHIELD | Attending: Family Medicine | Admitting: Family Medicine

## 2016-12-12 ENCOUNTER — Emergency Department (HOSPITAL_COMMUNITY)
Admission: EM | Admit: 2016-12-12 | Discharge: 2016-12-12 | Disposition: A | Discharge status: Home / Self Care | Payer: BLUE CROSS/BLUE SHIELD | Source: Home / Self Care | Attending: Emergency Medicine | Admitting: Emergency Medicine

## 2016-12-12 ENCOUNTER — Encounter (HOSPITAL_COMMUNITY): Payer: Self-pay

## 2016-12-12 DIAGNOSIS — I10 Essential (primary) hypertension: Secondary | ICD-10-CM

## 2016-12-12 DIAGNOSIS — M79609 Pain in unspecified limb: Secondary | ICD-10-CM | POA: Diagnosis not present

## 2016-12-12 DIAGNOSIS — M79604 Pain in right leg: Secondary | ICD-10-CM

## 2016-12-12 DIAGNOSIS — R1031 Right lower quadrant pain: Secondary | ICD-10-CM

## 2016-12-12 DIAGNOSIS — R200 Anesthesia of skin: Secondary | ICD-10-CM | POA: Insufficient documentation

## 2016-12-12 DIAGNOSIS — Z8673 Personal history of transient ischemic attack (TIA), and cerebral infarction without residual deficits: Secondary | ICD-10-CM | POA: Insufficient documentation

## 2016-12-12 DIAGNOSIS — I2699 Other pulmonary embolism without acute cor pulmonale: Secondary | ICD-10-CM

## 2016-12-12 DIAGNOSIS — M7989 Other specified soft tissue disorders: Secondary | ICD-10-CM

## 2016-12-12 DIAGNOSIS — Z86718 Personal history of other venous thrombosis and embolism: Secondary | ICD-10-CM

## 2016-12-12 DIAGNOSIS — R1030 Lower abdominal pain, unspecified: Secondary | ICD-10-CM

## 2016-12-12 LAB — CBC WITH DIFFERENTIAL/PLATELET
BASOS PCT: 0 %
Basophils Absolute: 0 10*3/uL (ref 0.0–0.1)
EOS ABS: 0.1 10*3/uL (ref 0.0–0.7)
EOS PCT: 1 %
HCT: 38.1 % — ABNORMAL LOW (ref 39.0–52.0)
Hemoglobin: 12.6 g/dL — ABNORMAL LOW (ref 13.0–17.0)
LYMPHS ABS: 1.9 10*3/uL (ref 0.7–4.0)
Lymphocytes Relative: 33 %
MCH: 31.8 pg (ref 26.0–34.0)
MCHC: 33.1 g/dL (ref 30.0–36.0)
MCV: 96.2 fL (ref 78.0–100.0)
MONOS PCT: 4 %
Monocytes Absolute: 0.3 10*3/uL (ref 0.1–1.0)
Neutro Abs: 3.5 10*3/uL (ref 1.7–7.7)
Neutrophils Relative %: 62 %
PLATELETS: 198 10*3/uL (ref 150–400)
RBC: 3.96 MIL/uL — ABNORMAL LOW (ref 4.22–5.81)
RDW: 13.7 % (ref 11.5–15.5)
WBC: 5.7 10*3/uL (ref 4.0–10.5)

## 2016-12-12 LAB — PROTIME-INR
INR: 0.9
INR: 1.15
Prothrombin Time: 12.1 seconds (ref 11.4–15.2)
Prothrombin Time: 14.8 seconds (ref 11.4–15.2)

## 2016-12-12 LAB — URINALYSIS, ROUTINE W REFLEX MICROSCOPIC
BILIRUBIN URINE: NEGATIVE
Glucose, UA: NEGATIVE mg/dL
HGB URINE DIPSTICK: NEGATIVE
KETONES UR: NEGATIVE mg/dL
Leukocytes, UA: NEGATIVE
NITRITE: NEGATIVE
PH: 7 (ref 5.0–8.0)
Protein, ur: NEGATIVE mg/dL
SPECIFIC GRAVITY, URINE: 1.027 (ref 1.005–1.030)

## 2016-12-12 LAB — BASIC METABOLIC PANEL
Anion gap: 10 (ref 5–15)
BUN: 8 mg/dL (ref 6–20)
CALCIUM: 9.1 mg/dL (ref 8.9–10.3)
CHLORIDE: 101 mmol/L (ref 101–111)
CO2: 27 mmol/L (ref 22–32)
CREATININE: 0.95 mg/dL (ref 0.61–1.24)
GFR calc Af Amer: >60 mL/min (ref >60)
GFR calc non Af Amer: >60 mL/min (ref >60)
Glucose, Bld: 82 mg/dL (ref 65–99)
Potassium: 4 mmol/L (ref 3.5–5.1)
SODIUM: 138 mmol/L (ref 135–145)

## 2016-12-12 LAB — APTT: aPTT: 26 seconds (ref 24–36)

## 2016-12-12 LAB — I-STAT TROPONIN, ED: TROPONIN I, POC: 0 ng/mL (ref 0.00–0.08)

## 2016-12-12 MED ORDER — ONDANSETRON HCL 4 MG/2ML IJ SOLN
4.0000 mg | Freq: Once | INTRAMUSCULAR | Status: AC
  Administered 2016-12-12: 4 mg via INTRAVENOUS
  Filled 2016-12-12: qty 2

## 2016-12-12 MED ORDER — MORPHINE SULFATE (PF) 4 MG/ML IV SOLN
4.0000 mg | Freq: Once | INTRAVENOUS | Status: AC
  Administered 2016-12-12: 4 mg via INTRAVENOUS
  Filled 2016-12-12: qty 1

## 2016-12-12 MED ORDER — HYDROCODONE-ACETAMINOPHEN 5-325 MG PO TABS
1.5000 | ORAL_TABLET | Freq: Once | ORAL | Status: AC
  Administered 2016-12-12: 1.5 via ORAL
  Filled 2016-12-12: qty 2

## 2016-12-12 MED ORDER — IOPAMIDOL (ISOVUE-370) INJECTION 76%
INTRAVENOUS | Status: AC
  Administered 2016-12-12: 100 mL via INTRAVENOUS
  Filled 2016-12-12: qty 100

## 2016-12-12 MED ORDER — RIVAROXABAN 20 MG PO TABS
20.0000 mg | ORAL_TABLET | Freq: Once | ORAL | Status: AC
  Administered 2016-12-12: 20 mg via ORAL
  Filled 2016-12-12: qty 1

## 2016-12-12 NOTE — Progress Notes (Signed)
**  Preliminary report by tech**  Right pseudoaneurysm observation complete. There is no evidence of a pseudoaneurysm involving the right lower extremity.  Right lower extremity venous duplex complete. There is no obvious evidence of a deep or superficial vein thrombosis involving the right lower extremity. All clearly visualized vessels appear patent and compressible. There is no evidence of a Baker's cyst on the right.  Results were given to Dr. Jacqulyn BathLong.  12/12/16 7:32 PM Olen CordialGreg Rickiya Picariello RVT

## 2016-12-12 NOTE — Discharge Instructions (Signed)
You were seen in the ED with left arm and face numbness along with pain and swelling in the right leg. We found no evidence of blood clot and would want you to continue taking the blood thinner, Xarelto. You will need to call and make an appointment with the Neurologist listed below to continue evaluation for your numbness.   Return to the ED with any worsening chest pain, difficulty breathing, bleeding, worsening numbness, or weakness.

## 2016-12-12 NOTE — ED Notes (Signed)
Pt verbalized understanding of d/c instructions and follow up. Pt ambulatory to WR, NAD.

## 2016-12-12 NOTE — ED Notes (Signed)
The patient was examined by W. Oxford-NP and [facility] for [facility]VT work [facility].
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
## 2016-12-12 NOTE — ED Triage Notes (Addendum)
Pt reports left side numbness. He was seen here this morning for the same thing and discharged. Pt went to UC and was sent back here. Pt was given xarelto prescription with help from case management prior to discharge. Pt does have hx of stroke. He also reports chest pain.
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
## 2016-12-12 NOTE — Discharge Instructions (Signed)
Go directly to ED
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
## 2016-12-12 NOTE — ED Notes (Signed)
U/s at bedside

## 2016-12-12 NOTE — Discharge Planning (Signed)
Pt given Xarelto brochure with refill assistance card intact.  Pt utilizes [facility] [address] for prescription needs.  NCM called pharmacy to confirm availability of medication.  Information relayed to pt.  Pt verbalizes importance of filling medication upon discharge.

## 2016-12-13 ENCOUNTER — Encounter (HOSPITAL_COMMUNITY): Payer: Self-pay | Admitting: Emergency Medicine

## 2016-12-13 ENCOUNTER — Ambulatory Visit (INDEPENDENT_AMBULATORY_CARE_PROVIDER_SITE_OTHER): Payer: Self-pay | Admitting: Student

## 2016-12-13 ENCOUNTER — Emergency Department (HOSPITAL_COMMUNITY): Payer: BLUE CROSS/BLUE SHIELD

## 2016-12-13 ENCOUNTER — Emergency Department (HOSPITAL_COMMUNITY)
Admission: EM | Admit: 2016-12-13 | Discharge: 2016-12-14 | Disposition: A | Discharge status: Home / Self Care | Payer: BLUE CROSS/BLUE SHIELD | Attending: Emergency Medicine | Admitting: Emergency Medicine

## 2016-12-13 VITALS — BP 108/66 | HR 89 | Temp 98.5°F | Resp 16 | Ht 71.0 in | Wt 173.0 lb

## 2016-12-13 DIAGNOSIS — G459 Transient cerebral ischemic attack, unspecified: Secondary | ICD-10-CM

## 2016-12-13 DIAGNOSIS — Z8673 Personal history of transient ischemic attack (TIA), and cerebral infarction without residual deficits: Secondary | ICD-10-CM | POA: Insufficient documentation

## 2016-12-13 DIAGNOSIS — I2699 Other pulmonary embolism without acute cor pulmonale: Secondary | ICD-10-CM

## 2016-12-13 DIAGNOSIS — G894 Chronic pain syndrome: Secondary | ICD-10-CM

## 2016-12-13 DIAGNOSIS — I10 Essential (primary) hypertension: Secondary | ICD-10-CM | POA: Insufficient documentation

## 2016-12-13 DIAGNOSIS — S71131A Puncture wound without foreign body, right thigh, initial encounter: Secondary | ICD-10-CM | POA: Insufficient documentation

## 2016-12-13 DIAGNOSIS — Z7901 Long term (current) use of anticoagulants: Secondary | ICD-10-CM | POA: Insufficient documentation

## 2016-12-13 DIAGNOSIS — S71131S Puncture wound without foreign body, right thigh, sequela: Secondary | ICD-10-CM

## 2016-12-13 DIAGNOSIS — I82409 Acute embolism and thrombosis of unspecified deep veins of unspecified lower extremity: Secondary | ICD-10-CM

## 2016-12-13 DIAGNOSIS — W3400XA Accidental discharge from unspecified firearms or gun, initial encounter: Secondary | ICD-10-CM

## 2016-12-13 DIAGNOSIS — R200 Anesthesia of skin: Secondary | ICD-10-CM

## 2016-12-13 DIAGNOSIS — S71101S Unspecified open wound, right thigh, sequela: Secondary | ICD-10-CM

## 2016-12-13 DIAGNOSIS — R079 Chest pain, unspecified: Secondary | ICD-10-CM | POA: Insufficient documentation

## 2016-12-13 DIAGNOSIS — W3400XS Accidental discharge from unspecified firearms or gun, sequela: Secondary | ICD-10-CM

## 2016-12-13 LAB — CBC
HCT: 38.4 % — ABNORMAL LOW (ref 39.0–52.0)
Hemoglobin: 13.3 g/dL (ref 13.0–17.0)
MCH: 32.6 pg (ref 26.0–34.0)
MCHC: 34.6 g/dL (ref 30.0–36.0)
MCV: 94.1 fL (ref 78.0–100.0)
PLATELETS: 207 10*3/uL (ref 150–400)
RBC: 4.08 MIL/uL — ABNORMAL LOW (ref 4.22–5.81)
RDW: 13.4 % (ref 11.5–15.5)
WBC: 7.1 10*3/uL (ref 4.0–10.5)

## 2016-12-13 LAB — I-STAT TROPONIN, ED: TROPONIN I, POC: 0.01 ng/mL (ref 0.00–0.08)

## 2016-12-13 MED ORDER — OXYCODONE-ACETAMINOPHEN 7.5-325 MG PO TABS: 1.0000 | ORAL_TABLET | ORAL | 0 refills | Status: DC | PRN

## 2016-12-13 MED ORDER — ASPIRIN EC 81 MG PO TBEC: 81.0000 mg | DELAYED_RELEASE_TABLET | Freq: Every day | ORAL | 1 refills | Status: DC

## 2016-12-13 MED ORDER — NITROGLYCERIN 0.4 MG SL SUBL: 0.4000 mg | SUBLINGUAL_TABLET | SUBLINGUAL | 1 refills | Status: DC | PRN
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
## 2016-12-13 NOTE — Patient Instructions (Addendum)
Please take xarelto daily  Please stop aspirin, do not fill prescription.  Follow up with pain management for narcotic management for chronic pain.       IF you received an x-ray today, you will receive an invoice from [facility]. Please contact [facility] at [phone] with questions or concerns regarding your invoice.   IF you received labwork today, you will receive an invoice from [facility]. Please contact [facility] at [phone] with questions or concerns regarding your invoice.   Our billing staff will not be able to assist you with questions regarding bills from these companies.  You will be contacted with the lab results as soon as they are available. The fastest way to get your results is to activate your My Chart account. Instructions are located on the last page of this paperwork. If you have not heard from us regarding the results in 2 weeks, please contact this office.

## 2016-12-13 NOTE — Assessment & Plan Note (Signed)
Needs to re-establish care with hematology, [place] and he would like to see one in this area.  Continue xarelto.

## 2016-12-13 NOTE — ED Triage Notes (Signed)
Patient of complaining of left side arm numbness and chest pain.
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
## 2016-12-13 NOTE — Assessment & Plan Note (Signed)
Has chronic pain, percocet refilled, allergies noted.  No NSAIDs due to xarelto therapy.  UDS taken.  Last rx filled in [place] was in November.  Does not appear to be abusing or distributing.  Referral to pain clinic placed.

## 2016-12-14 LAB — BASIC METABOLIC PANEL
ANION GAP: 9 (ref 5–15)
BUN: 13 mg/dL (ref 6–20)
CALCIUM: 8.7 mg/dL — AB (ref 8.9–10.3)
CO2: 28 mmol/L (ref 22–32)
Chloride: 100 mmol/L — ABNORMAL LOW (ref 101–111)
Creatinine, Ser: 0.92 mg/dL (ref 0.61–1.24)
Glucose, Bld: 95 mg/dL (ref 65–99)
Potassium: 3.1 mmol/L — ABNORMAL LOW (ref 3.5–5.1)
SODIUM: 137 mmol/L (ref 135–145)

## 2016-12-14 LAB — TROPONIN I: Troponin I: <0.03 ng/mL (ref <0.03)

## 2016-12-14 NOTE — Progress Notes (Signed)
   12/14/16 0250  Clinical Encounter Type  Visited With Patient  Visit Type Social support  Spiritual Encounters  Spiritual Needs Emotional  Stress Factors  Patient Stress Factors Major life changes  Paged by front desk of Pt wanting to talk with a chaplain. Introduction to Pt. Pt with suicidal ideation. Has no where to go and is trying to get work. Discussed coping mechanisms and Pt does have an appointment later in the morning to a social agency. Encouraged him to go to this appointment. Pt calmed. No further needs at this time.

## 2016-12-14 NOTE — ED Notes (Signed)
Patient was alert, oriented and stable upon discharge. RN went over AVS and patient had no further questions.  
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
## 2016-12-14 NOTE — Discharge Instructions (Signed)
Take your medications as directed  Drink plenty of fluids.  Important to follow-up with your primary care doctor, a neurologist, and a cardiologist for further evaluation, treatment

## 2016-12-15 ENCOUNTER — Encounter: Payer: Self-pay | Admitting: Neurology

## 2016-12-15 ENCOUNTER — Ambulatory Visit: Payer: BLUE CROSS/BLUE SHIELD

## 2016-12-15 ENCOUNTER — Encounter (HOSPITAL_COMMUNITY): Payer: Self-pay | Admitting: Emergency Medicine

## 2016-12-15 ENCOUNTER — Emergency Department (HOSPITAL_COMMUNITY)
Admission: EM | Admit: 2016-12-15 | Discharge: 2016-12-15 | Disposition: A | Discharge status: Home / Self Care | Payer: BLUE CROSS/BLUE SHIELD | Attending: Emergency Medicine | Admitting: Emergency Medicine

## 2016-12-15 ENCOUNTER — Emergency Department (EMERGENCY_DEPARTMENT_HOSPITAL)
Admission: EM | Admit: 2016-12-15 | Discharge: 2016-12-18 | Disposition: A | Discharge status: Other Acute Inpatient Hospital | Payer: Self-pay | Source: Home / Self Care | Attending: Emergency Medicine | Admitting: Emergency Medicine

## 2016-12-15 DIAGNOSIS — F3289 Other specified depressive episodes: Secondary | ICD-10-CM

## 2016-12-15 DIAGNOSIS — R45851 Suicidal ideations: Secondary | ICD-10-CM

## 2016-12-15 DIAGNOSIS — F329 Major depressive disorder, single episode, unspecified: Secondary | ICD-10-CM | POA: Insufficient documentation

## 2016-12-15 DIAGNOSIS — R440 Auditory hallucinations: Secondary | ICD-10-CM

## 2016-12-15 DIAGNOSIS — I10 Essential (primary) hypertension: Secondary | ICD-10-CM | POA: Insufficient documentation

## 2016-12-15 DIAGNOSIS — R441 Visual hallucinations: Secondary | ICD-10-CM

## 2016-12-15 DIAGNOSIS — Z7901 Long term (current) use of anticoagulants: Secondary | ICD-10-CM | POA: Insufficient documentation

## 2016-12-15 DIAGNOSIS — F330 Major depressive disorder, recurrent, mild: Secondary | ICD-10-CM | POA: Diagnosis present

## 2016-12-15 DIAGNOSIS — F333 Major depressive disorder, recurrent, severe with psychotic symptoms: Secondary | ICD-10-CM | POA: Diagnosis present

## 2016-12-15 DIAGNOSIS — Z8673 Personal history of transient ischemic attack (TIA), and cerebral infarction without residual deficits: Secondary | ICD-10-CM | POA: Insufficient documentation

## 2016-12-15 LAB — ETHANOL: Alcohol, Ethyl (B): <5 mg/dL (ref <5)

## 2016-12-15 LAB — COMPREHENSIVE METABOLIC PANEL
ALBUMIN: 4 g/dL (ref 3.5–5.0)
ALK PHOS: 51 U/L (ref 38–126)
ALT: 39 U/L (ref 17–63)
AST: 32 U/L (ref 15–41)
Anion gap: 8 (ref 5–15)
BUN: 10 mg/dL (ref 6–20)
CALCIUM: 9.2 mg/dL (ref 8.9–10.3)
CHLORIDE: 103 mmol/L (ref 101–111)
CO2: 28 mmol/L (ref 22–32)
CREATININE: 1 mg/dL (ref 0.61–1.24)
GFR calc non Af Amer: >60 mL/min (ref >60)
GLUCOSE: 90 mg/dL (ref 65–99)
Potassium: 4 mmol/L (ref 3.5–5.1)
SODIUM: 139 mmol/L (ref 135–145)
Total Bilirubin: 1.4 mg/dL — ABNORMAL HIGH (ref 0.3–1.2)
Total Protein: 7.5 g/dL (ref 6.5–8.1)

## 2016-12-15 LAB — TSH: TSH: 0.506 u[IU]/mL (ref 0.350–4.500)

## 2016-12-15 LAB — CBC
HEMATOCRIT: 38.1 % — AB (ref 39.0–52.0)
HEMOGLOBIN: 12.8 g/dL — AB (ref 13.0–17.0)
MCH: 32 pg (ref 26.0–34.0)
MCHC: 33.6 g/dL (ref 30.0–36.0)
MCV: 95.3 fL (ref 78.0–100.0)
Platelets: 215 10*3/uL (ref 150–400)
RBC: 4 MIL/uL — AB (ref 4.22–5.81)
RDW: 13.6 % (ref 11.5–15.5)
WBC: 6.4 10*3/uL (ref 4.0–10.5)

## 2016-12-15 LAB — SALICYLATE LEVEL

## 2016-12-15 LAB — T4, FREE: FREE T4: 1.06 ng/dL (ref 0.61–1.12)

## 2016-12-15 LAB — ACETAMINOPHEN LEVEL

## 2016-12-15 MED ORDER — ENSURE ENLIVE PO LIQD
237.0000 mL | Freq: Two times a day (BID) | ORAL | Status: DC
  Administered 2016-12-16 – 2016-12-18 (×5): 237 mL via ORAL
  Filled 2016-12-15 (×4): qty 237

## 2016-12-15 NOTE — ED Triage Notes (Signed)
Pt wondering in and out of triage and lobby on phone with crisis center, will get vitals when returns.
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
## 2016-12-15 NOTE — ED Triage Notes (Signed)
Pt st's he left here this am and [facility]  Pt st's he was told there to come back to ED because he was feeling like he wanted to hurt himself.

## 2016-12-15 NOTE — ED Notes (Signed)
Pt now saying he is depressed over loss of mother and other things going on in life. Pt trying to laugh and makes jokes in triage but is still depressed.

## 2016-12-15 NOTE — Discharge Instructions (Signed)
Please follow-up with [name] as scheduled at 8:00 tomorrow morning.
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
## 2016-12-16 LAB — RAPID URINE DRUG SCREEN, HOSP PERFORMED
AMPHETAMINES: NOT DETECTED
BARBITURATES: NOT DETECTED
BENZODIAZEPINES: NOT DETECTED
COCAINE: NOT DETECTED
Opiates: NOT DETECTED
Tetrahydrocannabinol: NOT DETECTED

## 2016-12-16 NOTE — ED Notes (Signed)
Pt belongings locked up in room. Valuables given to security. Home medications stored in Main Pharmacy.
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
## 2016-12-16 NOTE — ED Notes (Signed)
TTS at bedside. 

## 2016-12-16 NOTE — ED Notes (Signed)
Regular Diet was ordered for patient for Lunch. 

## 2016-12-16 NOTE — Progress Notes (Signed)
Reviewed pt's chart- pt was assessed by TTS 1/26 and inpatient treatment recommended. [facility]  Ilean SkillMeghan Mikelle Myrick, MSW, LCSW Clinical Social Work, Disposition  12/16/2016 [phone]

## 2016-12-17 DIAGNOSIS — F333 Major depressive disorder, recurrent, severe with psychotic symptoms: Secondary | ICD-10-CM

## 2016-12-17 DIAGNOSIS — W3400XA Accidental discharge from unspecified firearms or gun, initial encounter: Secondary | ICD-10-CM

## 2016-12-17 DIAGNOSIS — Z8249 Family history of ischemic heart disease and other diseases of the circulatory system: Secondary | ICD-10-CM

## 2016-12-17 DIAGNOSIS — Z880 Allergy status to penicillin: Secondary | ICD-10-CM

## 2016-12-17 DIAGNOSIS — Z79891 Long term (current) use of opiate analgesic: Secondary | ICD-10-CM

## 2016-12-17 DIAGNOSIS — Z9889 Other specified postprocedural states: Secondary | ICD-10-CM

## 2016-12-17 DIAGNOSIS — Z888 Allergy status to other drugs, medicaments and biological substances status: Secondary | ICD-10-CM

## 2016-12-17 DIAGNOSIS — G894 Chronic pain syndrome: Secondary | ICD-10-CM

## 2016-12-17 DIAGNOSIS — Z79899 Other long term (current) drug therapy: Secondary | ICD-10-CM

## 2016-12-17 DIAGNOSIS — Z823 Family history of stroke: Secondary | ICD-10-CM

## 2016-12-17 DIAGNOSIS — Z808 Family history of malignant neoplasm of other organs or systems: Secondary | ICD-10-CM

## 2016-12-17 DIAGNOSIS — S71101A Unspecified open wound, right thigh, initial encounter: Secondary | ICD-10-CM

## 2016-12-17 MED ORDER — PHENOL 1.4 % MT LIQD
1.0000 | OROMUCOSAL | Status: DC | PRN
  Administered 2016-12-17 (×2): 1 via OROMUCOSAL
  Filled 2016-12-17 (×2): qty 177

## 2016-12-17 MED ORDER — NITROGLYCERIN 0.4 MG SL SUBL: 0.4000 mg | SUBLINGUAL_TABLET | SUBLINGUAL | Status: DC | PRN

## 2016-12-17 MED ORDER — FLUOXETINE HCL 20 MG PO TABS
20.0000 mg | ORAL_TABLET | Freq: Every day | ORAL | Status: DC
  Administered 2016-12-17 – 2016-12-18 (×2): 20 mg via ORAL
  Filled 2016-12-17 (×4): qty 1

## 2016-12-17 MED ORDER — RIVAROXABAN 20 MG PO TABS
20.0000 mg | ORAL_TABLET | Freq: Every day | ORAL | Status: DC
  Administered 2016-12-17: 20 mg via ORAL
  Filled 2016-12-17 (×2): qty 1
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
## 2016-12-17 NOTE — ED Notes (Signed)
Attempted to administer 1400 and 1500 meds, pt sleeping. Respirations even, unlabored.

## 2016-12-17 NOTE — ED Notes (Signed)
Pt aware, per Renata Capriceonrad, NP, [facility], checking on pt's [facility]. Pharmacy Tech added pt's Prozac to med list. Pt had advised earlier he was not taking it - stated today he last took it 3 days ago.

## 2016-12-17 NOTE — ED Notes (Signed)
Hourly rounding reveals patient sleeping in room. No complaints, stable, in no acute distress. Q15 minute rounds and monitoring via Security Cameras to continue. 

## 2016-12-17 NOTE — ED Notes (Signed)
Report to include Situation, Background, Assessment, and Recommendations received from Janie Rambo RN. Patient alert and oriented, warm and dry, in no acute distress. Patient denies SI, HI, AVH and pain. Patient made aware of Q15 minute rounds and security cameras for their safety. Patient instructed to come to me with needs or concerns.  

## 2016-12-17 NOTE — ED Provider Notes (Addendum)
  Physical Exam  BP 117/79 (BP Location: Right Arm)   Pulse 63   Temp 98.6 F (37 C) (Oral)   Resp 18   SpO2 100%   Physical Exam  ED Course  Procedures  MDM [name] [name] from psych has seen patient. He recommends overnight observation in the ER. States he would not do well in the observation area at behavioral health. Will reevaluate tomorrow morning.       [name] CoreNathan Oreatha Fabry, MD 12/17/16 1222  [facility] Discussed with Dr. Ranae PalmsYelverton and he accepted the patient.    [name] CoreNathan Tiffanee Mcnee, MD 12/17/16 [id number]

## 2016-12-17 NOTE — ED Notes (Signed)
[facility] transporting pt to [facility] Psych ED. 

## 2016-12-17 NOTE — Progress Notes (Signed)
Per DNP Claudette Headonrad [place], patient to be observed overnight.  Melbourne Abtsatia Edith Lord, LCSWA Disposition staff 12/17/2016 12:30 PM

## 2016-12-17 NOTE — ED Notes (Signed)
Pt sts he does not eat pork.  Dietary services notified via phone.

## 2016-12-17 NOTE — ED Notes (Signed)
Pt now awake - voiced understanding and is in agreement w/tx [facility]

## 2016-12-17 NOTE — ED Notes (Signed)
Dr Ranae Palmsyelverton into see

## 2016-12-17 NOTE — ED Notes (Signed)
Pt lying on bed watching tv. Aware [facility] route [facility].

## 2016-12-17 NOTE — Progress Notes (Signed)
[facility] aware that patient wants to leave and needs a reassessment to recommend d/c or inpatient care.   [name] [name], LCSWA Disposition staff 12/17/2016 10:22 AM

## 2016-12-17 NOTE — ED Notes (Addendum)
Pt states he feels better and wants to leave. Denies SI/HI. Offered pt to shower - advised will later. Pt verbalized understanding and signed Medical Clearance Pt policy form. Copy given to pt - original placed on clipboard.

## 2016-12-17 NOTE — ED Notes (Signed)
Dr Ranae PalmsYelverton updated and will see

## 2016-12-17 NOTE — ED Notes (Signed)
Patient was given a snack and drink, and a regular diet was taken for Lunch. 

## 2016-12-17 NOTE — ED Notes (Signed)
[facility] for face-to-face.

## 2016-12-17 NOTE — ED Notes (Signed)
Pt ambulatory w/o difficulty to room 36.  Scrubs changed on arrival, security contacted to wand pt, supper given.

## 2016-12-18 DIAGNOSIS — F330 Major depressive disorder, recurrent, mild: Secondary | ICD-10-CM | POA: Diagnosis present

## 2016-12-18 DIAGNOSIS — R531 Weakness: Secondary | ICD-10-CM

## 2016-12-18 DIAGNOSIS — G459 Transient cerebral ischemic attack, unspecified: Secondary | ICD-10-CM

## 2016-12-18 DIAGNOSIS — R1011 Right upper quadrant pain: Secondary | ICD-10-CM

## 2016-12-18 MED ORDER — HYDROXYZINE HCL 25 MG PO TABS: 25.0000 mg | ORAL_TABLET | Freq: Four times a day (QID) | ORAL | Status: DC | PRN

## 2016-12-18 MED ORDER — TRAZODONE HCL 100 MG PO TABS: 100.0000 mg | ORAL_TABLET | Freq: Every day | ORAL | Status: DC

## 2016-12-18 MED ORDER — TRAZODONE HCL 100 MG PO TABS: 100.0000 mg | ORAL_TABLET | Freq: Every day | ORAL | 0 refills | Status: DC

## 2016-12-18 MED ORDER — HYDROXYZINE HCL 25 MG PO TABS: 25.0000 mg | ORAL_TABLET | Freq: Four times a day (QID) | ORAL | 0 refills | Status: DC | PRN

## 2016-12-18 MED ORDER — FLUOXETINE HCL 20 MG PO TABS: 20.0000 mg | ORAL_TABLET | Freq: Every day | ORAL | 0 refills | Status: DC

## 2016-12-18 NOTE — ED Notes (Signed)
Hourly rounding reveals patient sleeping in room. No complaints, stable, in no acute distress. Q15 minute rounds and monitoring via Security Cameras to continue. 

## 2016-12-18 NOTE — ED Notes (Signed)
Pt d/c home per MD order. Discharge summary reviewed with pt. Pt verbalizes understanding of discharge summary. Pt denies SI/HI/AVH. RX provided. Pt signed for personal property and property returned. Pt signed e-signature. Ambulatory off unit with [facility].
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
## 2016-12-18 NOTE — Progress Notes (Signed)
CSW spoke with patient at bedside regarding local mental health resources. CSW inquired about patient's current outpatient treatment. [facility] and will be able to follow up with [name]. CSW provided patient with local mental health resources handout and pointed out [facility] mobile crisis phone number. CSW asked patient if he needed any additional resources, patient replied no.

## 2016-12-19 LAB — 6-ACETYLMORPHINE,TOXASSURE ADD
6-ACETYLMORPHINE: NEGATIVE
6-acetylmorphine: NOT DETECTED ng/mg creat

## 2016-12-19 LAB — TOXASSURE SELECT 13 (MW), URINE

## 2016-12-21 ENCOUNTER — Other Ambulatory Visit: Payer: Self-pay

## 2016-12-21 NOTE — Telephone Encounter (Signed)
Pt seen 12/13/16 and referred to pain md, has not gotten an appt. Yet and needs a refill of oxycodone. Given #30 12/13/16 Can we refill?

## 2016-12-22 ENCOUNTER — Encounter: Payer: Self-pay | Admitting: Hematology

## 2016-12-22 ENCOUNTER — Telehealth: Payer: Self-pay | Admitting: Family Medicine

## 2016-12-22 NOTE — Telephone Encounter (Signed)
Notes from last office visit here reviewed. It appears we are trying to get him into pain management, and #30 of Percocet were provided that time. That medicine appears to be used up to every 4 hours, but not sure on exact dosing he has been using recently. I also noted his emergency department visit for depression symptoms, reported suicidal ideation and possible visual as well as auditory hallucinations.  It appears he was eventually cleared from psych standpoint and sent home with plan on follow-[facility] the [facility].  #30 of Percocet were prescribed at office visit on January 23rd. Based on [place] STOP Act, 5 day supply of opioids can be prescribed for acute pain. This appears to be a chronic pain issue, but he was a new patient to us as of last visit. Would recommend he return first thing tomorrow to meet with a provider to discuss and possibly provide narcotic pain medication refill (at the discretion of that provider) until he is able to meet with pain management. Would also recommend that visit to discuss his depression symptoms and make sure those have stabilized under the care of [name]. If any worsening of pain or need for evaluation overnight, would recommend evaluation at the emergency room.  I called provider number for patient, voicemail left for him to call back with status and can review this plan with him at that time.

## 2016-12-22 NOTE — Telephone Encounter (Signed)
Patient advised oxycodone refused.  pt states he is out today and only got 30 pills 12/13/16 and takes q 4 hours as needed. Betty at pain clinic is calling him back for an appointment but hasn't yet and she told him to call us for refill so he doesn't withdraw until they can see him per pt.  Can we refill or advise under these circumstances? Partner fill that here with your ok?

## 2016-12-23 NOTE — Telephone Encounter (Signed)
Noted. I can see him, but I did not see him last visit, so if easier for scheduling - can see any provider if needed.   He was seen by Dr. Zachery DauerBarnes last, but she will not be back until next Tuesday.

## 2016-12-23 NOTE — Telephone Encounter (Signed)
Pt called back and tx up front for appt with dr Neva Seatgreene

## 2016-12-24 ENCOUNTER — Ambulatory Visit (INDEPENDENT_AMBULATORY_CARE_PROVIDER_SITE_OTHER): Payer: Self-pay | Admitting: Family Medicine

## 2016-12-24 VITALS — BP 112/68 | HR 63 | Temp 98.5°F | Resp 16 | Ht 71.0 in | Wt 173.0 lb

## 2016-12-24 DIAGNOSIS — F329 Major depressive disorder, single episode, unspecified: Secondary | ICD-10-CM

## 2016-12-24 DIAGNOSIS — F32A Depression, unspecified: Secondary | ICD-10-CM

## 2016-12-24 DIAGNOSIS — R1031 Right lower quadrant pain: Secondary | ICD-10-CM

## 2016-12-24 DIAGNOSIS — M79604 Pain in right leg: Secondary | ICD-10-CM

## 2016-12-24 DIAGNOSIS — G8929 Other chronic pain: Secondary | ICD-10-CM

## 2016-12-24 MED ORDER — OXYCODONE-ACETAMINOPHEN 7.5-325 MG PO TABS: 1.0000 | ORAL_TABLET | Freq: Four times a day (QID) | ORAL | 0 refills | Status: DC | PRN

## 2016-12-24 NOTE — Patient Instructions (Addendum)
I provided a short-term prescription of oxycodone today until you can be seen by pain management. If refill needed prior to evaluation with pain management, return to discuss further. Try to space out the oxycodone to every 6 hours, or only as needed, and you should need that medication less if less walking. Try to use the [employer] for bus service if at all possible, again to lessen amount of walking and pain in your groin.  If you have any worsening of your depression, thoughts of hurting or harming yourself, or any hallucinations, call 911 or proceed to the emergency room immediately.   Follow-up with me to discuss health maintenance and your other health history within the next 1-2 months as I am your new primary care provider.   Return to the clinic or go to the nearest emergency room if any of your symptoms worsen or new symptoms occur.   IF you received an x-ray today, you will receive an invoice from [facility]. Please contact [facility] at [phone] with questions or concerns regarding your invoice.   IF you received labwork today, you will receive an invoice from [facility]. Please contact [facility] at [phone] with questions or concerns regarding your invoice.   Our billing staff will not be able to assist you with questions regarding bills from these companies.  You will be contacted with the lab results as soon as they are available. The fastest way to get your results is to activate your My Chart account. Instructions are located on the last page of this paperwork. If you have not heard from us regarding the results in 2 weeks, please contact this office.
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
## 2016-12-25 ENCOUNTER — Encounter: Payer: Self-pay | Admitting: Family Medicine

## 2016-12-28 ENCOUNTER — Emergency Department (HOSPITAL_COMMUNITY)
Admission: EM | Admit: 2016-12-28 | Discharge: 2016-12-30 | Disposition: A | Discharge status: Home / Self Care | Payer: Self-pay | Attending: Emergency Medicine | Admitting: Emergency Medicine

## 2016-12-28 ENCOUNTER — Encounter (HOSPITAL_COMMUNITY): Payer: Self-pay | Admitting: Emergency Medicine

## 2016-12-28 DIAGNOSIS — F330 Major depressive disorder, recurrent, mild: Secondary | ICD-10-CM | POA: Diagnosis present

## 2016-12-28 DIAGNOSIS — R45851 Suicidal ideations: Secondary | ICD-10-CM

## 2016-12-28 DIAGNOSIS — Z7901 Long term (current) use of anticoagulants: Secondary | ICD-10-CM | POA: Insufficient documentation

## 2016-12-28 DIAGNOSIS — F329 Major depressive disorder, single episode, unspecified: Secondary | ICD-10-CM | POA: Insufficient documentation

## 2016-12-28 DIAGNOSIS — F32A Depression, unspecified: Secondary | ICD-10-CM

## 2016-12-28 DIAGNOSIS — I10 Essential (primary) hypertension: Secondary | ICD-10-CM | POA: Insufficient documentation

## 2016-12-28 DIAGNOSIS — Z79899 Other long term (current) drug therapy: Secondary | ICD-10-CM | POA: Insufficient documentation

## 2016-12-28 DIAGNOSIS — Z8673 Personal history of transient ischemic attack (TIA), and cerebral infarction without residual deficits: Secondary | ICD-10-CM | POA: Insufficient documentation

## 2016-12-28 LAB — COMPREHENSIVE METABOLIC PANEL
ALK PHOS: 48 U/L (ref 38–126)
ALT: 24 U/L (ref 17–63)
AST: 24 U/L (ref 15–41)
Albumin: 3.8 g/dL (ref 3.5–5.0)
Anion gap: 8 (ref 5–15)
BUN: 17 mg/dL (ref 6–20)
CALCIUM: 9.2 mg/dL (ref 8.9–10.3)
CO2: 27 mmol/L (ref 22–32)
CREATININE: 1.08 mg/dL (ref 0.61–1.24)
Chloride: 101 mmol/L (ref 101–111)
Glucose, Bld: 119 mg/dL — ABNORMAL HIGH (ref 65–99)
Potassium: 4.2 mmol/L (ref 3.5–5.1)
SODIUM: 136 mmol/L (ref 135–145)
Total Bilirubin: 0.5 mg/dL (ref 0.3–1.2)
Total Protein: 7 g/dL (ref 6.5–8.1)

## 2016-12-28 LAB — RAPID URINE DRUG SCREEN, HOSP PERFORMED
Amphetamines: NOT DETECTED
BARBITURATES: NOT DETECTED
Benzodiazepines: NOT DETECTED
Cocaine: NOT DETECTED
OPIATES: NOT DETECTED
TETRAHYDROCANNABINOL: NOT DETECTED

## 2016-12-28 LAB — CBC
HCT: 38.4 % — ABNORMAL LOW (ref 39.0–52.0)
HEMOGLOBIN: 12.6 g/dL — AB (ref 13.0–17.0)
MCH: 31.7 pg (ref 26.0–34.0)
MCHC: 32.8 g/dL (ref 30.0–36.0)
MCV: 96.5 fL (ref 78.0–100.0)
Platelets: 286 10*3/uL (ref 150–400)
RBC: 3.98 MIL/uL — ABNORMAL LOW (ref 4.22–5.81)
RDW: 14.2 % (ref 11.5–15.5)
WBC: 5.4 10*3/uL (ref 4.0–10.5)

## 2016-12-28 LAB — ETHANOL: Alcohol, Ethyl (B): <5 mg/dL (ref <5)

## 2016-12-28 LAB — ACETAMINOPHEN LEVEL: Acetaminophen (Tylenol), Serum: <10 ug/mL — ABNORMAL LOW (ref 10–30)

## 2016-12-28 LAB — SALICYLATE LEVEL
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
## 2016-12-28 NOTE — ED Triage Notes (Signed)
C/o suicidal ideation x 2 days.  States he is unsure of his plan at this time.  States he was seen in ED for same approx 2 weeks ago and was feeling good after [name] changed his medication but has started having suicidal thoughts again.

## 2016-12-29 MED ORDER — HYDROXYZINE HCL 25 MG PO TABS
25.0000 mg | ORAL_TABLET | Freq: Four times a day (QID) | ORAL | Status: DC | PRN
  Administered 2016-12-29 – 2016-12-30 (×2): 25 mg via ORAL
  Filled 2016-12-29 (×2): qty 1

## 2016-12-29 MED ORDER — ACETAMINOPHEN 325 MG PO TABS
650.0000 mg | ORAL_TABLET | Freq: Once | ORAL | Status: AC
  Administered 2016-12-29: 650 mg via ORAL
  Filled 2016-12-29: qty 2

## 2016-12-29 MED ORDER — RIVAROXABAN 20 MG PO TABS
20.0000 mg | ORAL_TABLET | Freq: Every day | ORAL | Status: DC
  Administered 2016-12-29 – 2016-12-30 (×2): 20 mg via ORAL
  Filled 2016-12-29 (×3): qty 1

## 2016-12-29 MED ORDER — TRAZODONE HCL 100 MG PO TABS
100.0000 mg | ORAL_TABLET | Freq: Every day | ORAL | Status: DC
  Administered 2016-12-29: 100 mg via ORAL
  Filled 2016-12-29: qty 1

## 2016-12-29 MED ORDER — FLUOXETINE HCL 20 MG PO CAPS
20.0000 mg | ORAL_CAPSULE | Freq: Every day | ORAL | Status: DC
  Administered 2016-12-29 – 2016-12-30 (×2): 20 mg via ORAL
  Filled 2016-12-29 (×2): qty 1

## 2016-12-29 NOTE — Progress Notes (Signed)
Per Karleen HampshireSpencer, [place] meets inpatient criteria Rajah Lamba K. Sherlon HandingHarris, LCAS-A, LPC-A, [facility]  Counselor 12/29/2016 1:36 AM

## 2016-12-29 NOTE — ED Notes (Signed)
Breakfast tray ordered 
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
## 2016-12-29 NOTE — ED Notes (Signed)
Dinner tray ordered.

## 2016-12-29 NOTE — Progress Notes (Signed)
LCSW following for disposition. Patient has been referred out to the following facilities for referral review: [facility]: [facility]  Will follow up with referrals and placement for patient.  Deretha EmoryHannah Chaquita Basques LCSW, MSW Clinical Social Work: Optician, dispensingystem Wide Float Coverage for :  [phone]

## 2016-12-29 NOTE — ED Notes (Signed)
A  Regular Diet was ordered for Lunch. 

## 2016-12-29 NOTE — ED Triage Notes (Addendum)
PT reported he was allergic to tylenol . Tylenol makes his throat swell. I added tylenol to PT's ALL list. Tylenol not given.

## 2016-12-29 NOTE — ED Provider Notes (Signed)
Report taken from Glenford BayleyAlex Law, PA-C. Patient is medically cleared. Awaiting TTS evaluation.  TTS evaluated the patient and gave a recommendation for inpatient psychiatric treatment. Patient to be maintained in psych hold until he is transferred to an accepting facility.   Results for orders placed or performed during the hospital encounter of 12/28/16  Comprehensive metabolic panel  Result Value Ref Range   Sodium 136 135 - 145 mmol/L   Potassium 4.2 3.5 - 5.1 mmol/L   Chloride 101 101 - 111 mmol/L   CO2 27 22 - 32 mmol/L   Glucose, Bld 119 (H) 65 - 99 mg/dL   BUN 17 6 - 20 mg/dL   Creatinine, Ser 2.951.08 0.61 - 1.24 mg/dL   Calcium 9.2 8.9 - 62.110.3 mg/dL   Total Protein 7.0 6.5 - 8.1 g/dL   Albumin 3.8 3.5 - 5.0 g/dL   AST 24 15 - 41 U/L   ALT 24 17 - 63 U/L   Alkaline Phosphatase 48 38 - 126 U/L   Total Bilirubin 0.5 0.3 - 1.2 mg/dL   GFR calc non Af Amer >60 >60 mL/min   GFR calc Af Amer >60 >60 mL/min   Anion gap 8 5 - 15  Ethanol  Result Value Ref Range   Alcohol, Ethyl (B) <5 <5 mg/dL  Salicylate level  Result Value Ref Range   Salicylate Lvl <7.0 2.8 - 30.0 mg/dL  Acetaminophen level  Result Value Ref Range   Acetaminophen (Tylenol), Serum <10 (L) 10 - 30 ug/mL  cbc  Result Value Ref Range   WBC 5.4 4.0 - 10.5 K/uL   RBC 3.98 (L) 4.22 - 5.81 MIL/uL   Hemoglobin 12.6 (L) 13.0 - 17.0 g/dL   HCT 30.838.4 (L) 65.739.0 - 84.652.0 %   MCV 96.5 78.0 - 100.0 fL   MCH 31.7 26.0 - 34.0 pg   MCHC 32.8 30.0 - 36.0 g/dL   RDW 96.214.2 95.211.5 - 84.115.5 %   Platelets 286 150 - 400 K/uL  Rapid urine drug screen (hospital performed)  Result Value Ref Range   Opiates NONE DETECTED NONE DETECTED   Cocaine NONE DETECTED NONE DETECTED   Benzodiazepines NONE DETECTED NONE DETECTED   Amphetamines NONE DETECTED NONE DETECTED   Tetrahydrocannabinol NONE DETECTED NONE DETECTED   Barbiturates NONE DETECTED NONE DETECTED        Anselm PancoastShawn C Tobias Avitabile, PA-C 12/29/16 0217    Pricilla LovelessScott Goldston, MD 01/02/17 1718

## 2016-12-30 DIAGNOSIS — R45851 Suicidal ideations: Secondary | ICD-10-CM

## 2016-12-30 DIAGNOSIS — F330 Major depressive disorder, recurrent, mild: Secondary | ICD-10-CM

## 2016-12-30 NOTE — ED Notes (Signed)
Lunch order placed

## 2016-12-30 NOTE — ED Provider Notes (Signed)
Blood pressure 99/55, pulse 69, temperature 98.6 F (37 C), temperature source Oral, resp. rate 16, SpO2 98 %.  Assuming care from psychiatry team.  In short, [name] [name] is a 46 y.o. male with a chief complaint of Suicidal .  Refer to the original H&P for additional details.  The current plan of care is to follow patient and assist with disposition.   09:50 PM Approach by nursing staff regarding the patient's disposition. Apparently he was seen by behavioral health with note that outlines plan to discharge home with behavioral health resources. This was apparently not communicated to the emergency department team. I evaluated the patient and reviewed the discharge plan in detail with him. He is in agreement. Denies SI/HI. Seems appropriate. Verbalizes understanding. Will discharge at this time.   [name] BeneJoshua Long, MD    [name] PlanJoshua G Long, MD 12/31/16 [phone]

## 2016-12-30 NOTE — ED Notes (Signed)
Snacks given 

## 2016-12-30 NOTE — ED Notes (Signed)
Transported pt to shower, pt back in room.

## 2016-12-30 NOTE — ED Notes (Signed)
Dinner order placed 
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
## 2016-12-30 NOTE — Discharge Instructions (Signed)
Substance Abuse Treatment Programs ° °Intensive Outpatient Programs °[facility]     °[address]      °[place], [place]                   °[phone]      ° °[facility] °[address] °[place], [place] °[phone] ° °[facility]     °(Inpatient and outpatient)     °[address]           °[phone]   ° °[facility] °[phone] (Suboxone and Methadone) ° °[address]      °[place], [place] [address]      °[phone]      ° °[address] °[place], [place] °852-3033 ° °[facility] (Outpatient/Inpatient, [facility])    °(insurance only) [phone]      °       °[facility] (Groups & Residential) °[place], [place] °[phone] ° °   °[facility]     °[address]     °[place], [place]      °[phone]      ° °[facility] (for caregivers and family) °[address] °[place], [place] °[phone] ° ° ° ° ° °Residential Treatment Programs °[facility]      °[address], [place], [place] [address]  °[phone]      ° °[facility] °[address] , [place] [address] °[phone] ° °[facility]        °[phone]      ° °[facility] °[phone] ° °[facility]             °[address]                                         °[place], [place]                                                °[phone] or [phone]                              ° °[facility] [place] °[address] °[place], [address] °[phone] ° °[facility]    °[address]      °[place], [place]      °[phone]      ° °[facility] °[address] °[place], [place] °[phone] ° °[facility]   °[address]     °[place], [place] [address]     °[phone]      °Admissions: 8am-3pm M-F ° °[facility] °[address] °[place],  [place] °[phone] ° °[facility]: Residential Program (90 Days)   °[place], [place]      °[phone] or [phone]    ° °[facility] °[place],  °(Walk in Hours over the weekend or by referral) ° °[facility] °[address], [place],  [address] °([phone] ° °Crisis Mobile: [facility]:  1-877-626-1772 (for crisis response 24 hours a day) °[facility] Hotline:      1-800-256-2452 °Outpatient Psychiatry and Counseling ° °[facility]: Mobile Crisis   Management 24 hours:  1-877-626-1772 ° °[facility] sliding scale fee and walk in schedule: M-F 8am-12pm/1pm-3pm °[address]  °[place], [place] [address] °[phone] ° °[facility] °[address] °[place], [place] [address] °[phone] ° °[facility] ([name])- new patient walk-in appointments available Monday - Friday 8am -3pm.          °[address] °[place], [place] [address] °[phone] or crisis line- 336-676-6905 ° °[facility] Intensive Outpatient Therapy Program °[address] °[place], [place] [address] °[phone] ° °[facility]                  °Crisis Services      °[phone]      °[address]     °[place], [place] [address]                ° °[facility]   °[facility] °[phone] °[address] °[place], [place] [address] ° ° °[facility]          °[address]  °[place], [place] [address]       °[phone] ° °[facility] °[address] °[place], [place] [address] °[phone] ° °[facility]    °[address]    °[place], [place] [address]     °[phone]      ° °Parish McKinney, MD     °[address]     °[place] [address]     °[phone]     °  °[facility] °[address] °[place] [address] ° °[facility]     °[address]     °[place], [place]      °[phone]      ° °[facility] °[name] Ahluwalia, MD °[address] °[place], [place] [address] °[phone] ° °[facility]     °[address]     °[place], [place] [address]     °[phone]      ° °[facility] °[address] °[place], [place] [address] °336-854-4450 °Resources for Temporary Residential Assistance/Crisis Centers ° °DAY CENTERS °[facility] °M-F 8am-3pm   °[address] [place], [place] [address]   336-332-0824 °Services include: laundry, barbering, support groups, case management, phone  & computer access, showers, AA/NA mtgs, mental health/substance abuse nurse, job skills class, disability information, VA assistance, spiritual classes, etc.  ° °HOMELESS SHELTERS ° °[facility]     °[facility]   °[address], [place]     °[phone]       °       °[facility] (women and children)       °[address] °[place], [place] [address] °336-275-0820 °[email or web address] for application and process °Application Required ° °[facility]   °[address]    °[place] [address]     °[phone]       °             °[facility] °[address] °, [place] [address] °[phone] °336-235-0363(schedule application appt.) °Application Required ° °[facility] (women only)    °[address]     °[place], [place] [address]     °336-884-1039      °  Intake starts 6pm daily °Need valid ID, SSC, & Police report °[facility] [place] °[address] °[place], [place] °336-881-5420 °Application Required ° °[facility] (men only)     °[address]      °[place], [place]     °[phone]      ° °[facility] °(Pregnant women only) °[address] °[place], [place] °336-275-0206 ° °[facility]      °[address]      °[place], [place] [address]     °[phone]      °       °[facility] [place] [facility] °[address] °[place], [place] °336-723-1848 °90 day commitment/SA/Application process ° °[facility](men only)     °[address]     °[place], [place]     °[phone]       °Check-in at 7pm     °       °[facility] °[address] °[place], [place] [address] °336-248-6684 °Men/Women/Women and Children must be there by 7 pm ° °[facility] °[place], [place] °336-722-8721                ° °

## 2017-01-11 ENCOUNTER — Encounter: Payer: Self-pay | Admitting: Hematology

## 2017-01-26 ENCOUNTER — Encounter (HOSPITAL_COMMUNITY): Payer: Self-pay | Admitting: Emergency Medicine

## 2017-01-26 ENCOUNTER — Emergency Department (HOSPITAL_COMMUNITY)
Admission: EM | Admit: 2017-01-26 | Discharge: 2017-01-26 | Disposition: A | Discharge status: Home / Self Care | Payer: BLUE CROSS/BLUE SHIELD | Attending: Emergency Medicine | Admitting: Emergency Medicine

## 2017-01-26 DIAGNOSIS — Z7901 Long term (current) use of anticoagulants: Secondary | ICD-10-CM | POA: Insufficient documentation

## 2017-01-26 DIAGNOSIS — Z760 Encounter for issue of repeat prescription: Secondary | ICD-10-CM

## 2017-01-26 DIAGNOSIS — Z8673 Personal history of transient ischemic attack (TIA), and cerebral infarction without residual deficits: Secondary | ICD-10-CM | POA: Insufficient documentation

## 2017-01-26 DIAGNOSIS — I10 Essential (primary) hypertension: Secondary | ICD-10-CM | POA: Insufficient documentation

## 2017-01-26 DIAGNOSIS — R600 Localized edema: Secondary | ICD-10-CM | POA: Insufficient documentation

## 2017-01-26 MED ORDER — RIVAROXABAN 20 MG PO TABS
20.0000 mg | ORAL_TABLET | Freq: Once | ORAL | Status: AC
  Administered 2017-01-26: 20 mg via ORAL
  Filled 2017-01-26: qty 1

## 2017-01-26 MED ORDER — RIVAROXABAN 20 MG PO TABS: 20.0000 mg | ORAL_TABLET | Freq: Every day | ORAL | 0 refills | Status: DC

## 2017-01-26 MED ORDER — OXYCODONE HCL 5 MG PO TABS
5.0000 mg | ORAL_TABLET | Freq: Once | ORAL | Status: AC
  Administered 2017-01-26: 5 mg via ORAL
  Filled 2017-01-26: qty 1

## 2017-01-26 MED ORDER — OXYCODONE HCL 5 MG PO TABS: 5.0000 mg | ORAL_TABLET | Freq: Two times a day (BID) | ORAL | 0 refills | Status: DC | PRN
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
## 2017-01-26 NOTE — ED Triage Notes (Addendum)
Pt presents with R leg swelling and pain from behind the knee to his groin area x 2 days (hx chronic R leg pain), reports hx of DVT, on Xarelto. Also c/o L facial and jaw pain - had surgery done 2 months ago and states he feels like something popped out of place in his upper jaw because he can't eat anything or open mouth wide d/t limited motion. Pt ambulatory with limp d/t pain in R leg. Denies SOB/CP/dizziness.

## 2017-01-28 ENCOUNTER — Encounter (HOSPITAL_COMMUNITY): Payer: Self-pay | Admitting: Emergency Medicine

## 2017-01-28 ENCOUNTER — Emergency Department (HOSPITAL_COMMUNITY)
Admission: EM | Admit: 2017-01-28 | Discharge: 2017-01-29 | Disposition: A | Discharge status: Home / Self Care | Payer: Self-pay | Attending: Emergency Medicine | Admitting: Emergency Medicine

## 2017-01-28 DIAGNOSIS — D689 Coagulation defect, unspecified: Secondary | ICD-10-CM | POA: Insufficient documentation

## 2017-01-28 DIAGNOSIS — F332 Major depressive disorder, recurrent severe without psychotic features: Secondary | ICD-10-CM | POA: Diagnosis present

## 2017-01-28 DIAGNOSIS — F333 Major depressive disorder, recurrent, severe with psychotic symptoms: Secondary | ICD-10-CM | POA: Diagnosis present

## 2017-01-28 DIAGNOSIS — I10 Essential (primary) hypertension: Secondary | ICD-10-CM | POA: Insufficient documentation

## 2017-01-28 DIAGNOSIS — F3289 Other specified depressive episodes: Secondary | ICD-10-CM | POA: Insufficient documentation

## 2017-01-28 DIAGNOSIS — Z8673 Personal history of transient ischemic attack (TIA), and cerebral infarction without residual deficits: Secondary | ICD-10-CM | POA: Insufficient documentation

## 2017-01-28 DIAGNOSIS — F10129 Alcohol abuse with intoxication, unspecified: Secondary | ICD-10-CM | POA: Diagnosis present

## 2017-01-28 DIAGNOSIS — Z79899 Other long term (current) drug therapy: Secondary | ICD-10-CM | POA: Insufficient documentation

## 2017-01-28 DIAGNOSIS — R3129 Other microscopic hematuria: Secondary | ICD-10-CM | POA: Insufficient documentation

## 2017-01-28 LAB — COMPREHENSIVE METABOLIC PANEL
ALBUMIN: 3.9 g/dL (ref 3.5–5.0)
ALT: 19 U/L (ref 17–63)
ANION GAP: 10 (ref 5–15)
AST: 22 U/L (ref 15–41)
Alkaline Phosphatase: 51 U/L (ref 38–126)
BILIRUBIN TOTAL: 0.9 mg/dL (ref 0.3–1.2)
BUN: 7 mg/dL (ref 6–20)
CALCIUM: 9 mg/dL (ref 8.9–10.3)
CO2: 29 mmol/L (ref 22–32)
Chloride: 102 mmol/L (ref 101–111)
Creatinine, Ser: 0.98 mg/dL (ref 0.61–1.24)
Glucose, Bld: 75 mg/dL (ref 65–99)
POTASSIUM: 4.4 mmol/L (ref 3.5–5.1)
Sodium: 141 mmol/L (ref 135–145)
TOTAL PROTEIN: 7.3 g/dL (ref 6.5–8.1)

## 2017-01-28 LAB — RAPID URINE DRUG SCREEN, HOSP PERFORMED
Amphetamines: NOT DETECTED
Barbiturates: NOT DETECTED
Benzodiazepines: NOT DETECTED
Cocaine: NOT DETECTED
OPIATES: NOT DETECTED
Tetrahydrocannabinol: NOT DETECTED

## 2017-01-28 LAB — URINALYSIS, ROUTINE W REFLEX MICROSCOPIC
Bilirubin Urine: NEGATIVE
GLUCOSE, UA: NEGATIVE mg/dL
Ketones, ur: NEGATIVE mg/dL
Nitrite: NEGATIVE
PH: 5 (ref 5.0–8.0)
Protein, ur: NEGATIVE mg/dL
SPECIFIC GRAVITY, URINE: 1.003 — AB (ref 1.005–1.030)

## 2017-01-28 LAB — CBC WITH DIFFERENTIAL/PLATELET
BASOS PCT: 1 %
Basophils Absolute: 0 10*3/uL (ref 0.0–0.1)
Eosinophils Absolute: 0.3 10*3/uL (ref 0.0–0.7)
Eosinophils Relative: 5 %
HEMATOCRIT: 41.2 % (ref 39.0–52.0)
HEMOGLOBIN: 13.9 g/dL (ref 13.0–17.0)
LYMPHS ABS: 3 10*3/uL (ref 0.7–4.0)
LYMPHS PCT: 52 %
MCH: 32.3 pg (ref 26.0–34.0)
MCHC: 33.7 g/dL (ref 30.0–36.0)
MCV: 95.6 fL (ref 78.0–100.0)
MONOS PCT: 3 %
Monocytes Absolute: 0.2 10*3/uL (ref 0.1–1.0)
NEUTROS ABS: 2.2 10*3/uL (ref 1.7–7.7)
NEUTROS PCT: 39 %
Platelets: 305 10*3/uL (ref 150–400)
RBC: 4.31 MIL/uL (ref 4.22–5.81)
RDW: 14.2 % (ref 11.5–15.5)
WBC: 5.7 10*3/uL (ref 4.0–10.5)

## 2017-01-28 LAB — SALICYLATE LEVEL: Salicylate Lvl: <7 mg/dL (ref 2.8–30.0)

## 2017-01-28 LAB — PROTIME-INR
INR: 1.01
Prothrombin Time: 13.3 seconds (ref 11.4–15.2)

## 2017-01-28 LAB — ETHANOL: ALCOHOL ETHYL (B): 143 mg/dL — AB (ref <5)

## 2017-01-28 LAB — ACETAMINOPHEN LEVEL

## 2017-01-28 MED ORDER — RIVAROXABAN 20 MG PO TABS
20.0000 mg | ORAL_TABLET | Freq: Every day | ORAL | Status: DC
  Administered 2017-01-28: 20 mg via ORAL
  Filled 2017-01-28 (×3): qty 1

## 2017-01-28 MED ORDER — RIVAROXABAN 20 MG PO TABS
20.0000 mg | ORAL_TABLET | Freq: Once | ORAL | Status: AC
  Administered 2017-01-28: 20 mg via ORAL
  Filled 2017-01-28: qty 1

## 2017-01-28 MED ORDER — TRAZODONE HCL 100 MG PO TABS
100.0000 mg | ORAL_TABLET | Freq: Every day | ORAL | Status: DC
  Administered 2017-01-28: 100 mg via ORAL
  Filled 2017-01-28: qty 1

## 2017-01-28 MED ORDER — FLUOXETINE HCL 20 MG PO CAPS
20.0000 mg | ORAL_CAPSULE | Freq: Every day | ORAL | Status: DC
Start: 2017-01-28 — End: 2017-01-29
  Administered 2017-01-28 – 2017-01-29 (×2): 20 mg via ORAL
  Filled 2017-01-28 (×3): qty 1

## 2017-01-28 NOTE — ED Notes (Signed)
Pt ambulatory to F11 w/EMT. Pt wearing burgundy scrubs. Pt's belongings - 2 labeled belongings bags placed at desk for inventory.
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
## 2017-01-28 NOTE — ED Notes (Signed)
Pt also c/o hematuria since yesterday

## 2017-01-28 NOTE — ED Triage Notes (Signed)
Pt presents with concern about medication prescribed at last visit; states he needs blood thinner for current blood clot in right groin however cannot afford xarelto;

## 2017-01-28 NOTE — ED Notes (Signed)
Regular Diet was ordered for patient. 

## 2017-01-28 NOTE — ED Notes (Signed)
Pt verbalized understanding and signed Medical Clearance Pt Policy form - copy given to pt. Pt also aware Dahlia ClientHannah, SW, and Grovel[place]anda, CM, aware of need for assistance w/Xarelto - pt has "Good Rx card" but med is still over $400.

## 2017-01-28 NOTE — BH Assessment (Signed)
Called to set up TTS consult for the pt. Spoke with Isabell JarvisScott C McCord, RN who stated he would place the cart in the pt's room to complete the assessment.  Princess BruinsAquicha Duff, MSW, LCSWA TTS Specialist  [phone]

## 2017-01-28 NOTE — Progress Notes (Addendum)
Patient was referred to inpatient treatment at the following facilities: [facility] - per [facility]: "Fax referral and provider will contact if pt is appropriate." [facility] and [facility] are both accepting referrals for the waitlist. [facility] informed that they have 1 male bed today.  All the other facilities are at capacity today: [facility], [facility], [facility], [facility], [facility], [address] [facility], [address] [place], [address] [place], [address] [place], [facility].  CSW will continue to seek placement for patient.  Melbourne Abtsatia Jayde Daffin, LCSWA Disposition staff 01/28/2017 12:11 PM

## 2017-01-28 NOTE — ED Notes (Signed)
Regular Diet has been ordered for Dinner. 

## 2017-01-28 NOTE — Progress Notes (Signed)
Informed by ED Charge Nurse patient needing assistance w medication (Xarelto) per information in E chart and patient confirmation he has insurance.  Explained that ProCare/Match program only for those who are uninsured.  Provided patient with information to sign up with Xarelto's savings Program and a Good RX coupon.  Suggested patient inform his provider of inability to afford (Xarelto) and ask if a less expensive medication could be prescribed.  Patient stated he had no other CM needs at this time.  Updated ER Charge nurse of discussion.    [name] GurneyL. J. Mizraim Harmening RN, BSN, CM

## 2017-01-28 NOTE — ED Notes (Signed)
Snack and drink was given to patient, and a Regular Diet was taken for Lunch.
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
## 2017-01-28 NOTE — ED Notes (Addendum)
Pt is here because he is unable to afford his Xarelto. Pt wants an alertnative for his medications. Pt states he has DVT's. Advised pt we are not able to give him any medication samples for free. Pt got upset talking about how he doesn't want anything for free. Pt then started to cuss and get aggressive. Pt stated he was leaving.

## 2017-01-29 DIAGNOSIS — F333 Major depressive disorder, recurrent, severe with psychotic symptoms: Secondary | ICD-10-CM

## 2017-01-29 DIAGNOSIS — F332 Major depressive disorder, recurrent severe without psychotic features: Secondary | ICD-10-CM | POA: Diagnosis present

## 2017-01-29 DIAGNOSIS — F10129 Alcohol abuse with intoxication, unspecified: Secondary | ICD-10-CM | POA: Diagnosis present

## 2017-01-29 IMAGING — CR DG CHEST 2V
2 series · 2 of 2 positions shown · non-contrast
Comparison: 12/11/2016

CLINICAL DATA: Right lower extremity swelling x1 day with
dizziness. History of DVT and PE per patient.

EXAM:
CHEST  2 VIEW

[w chest pa]
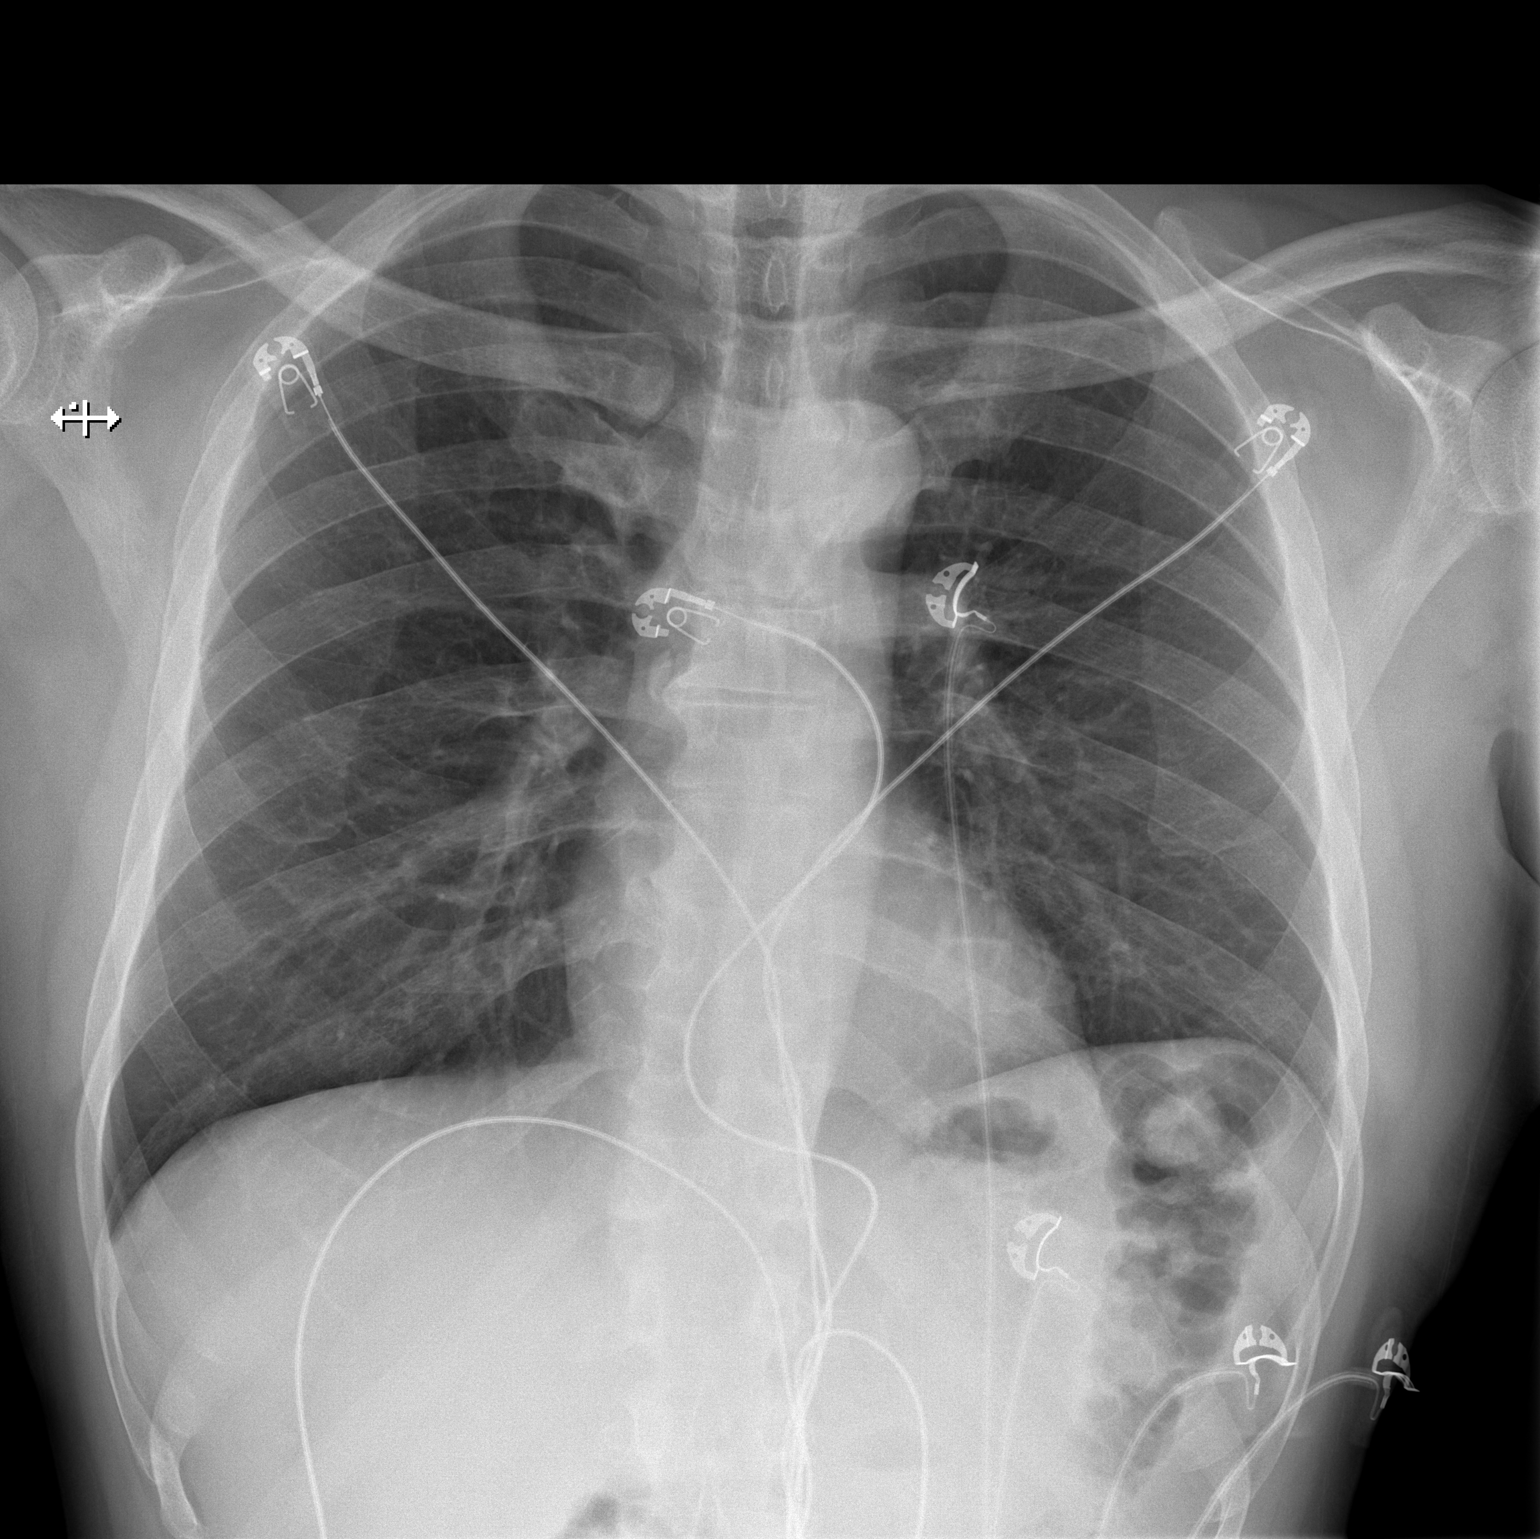

[w chest lat]
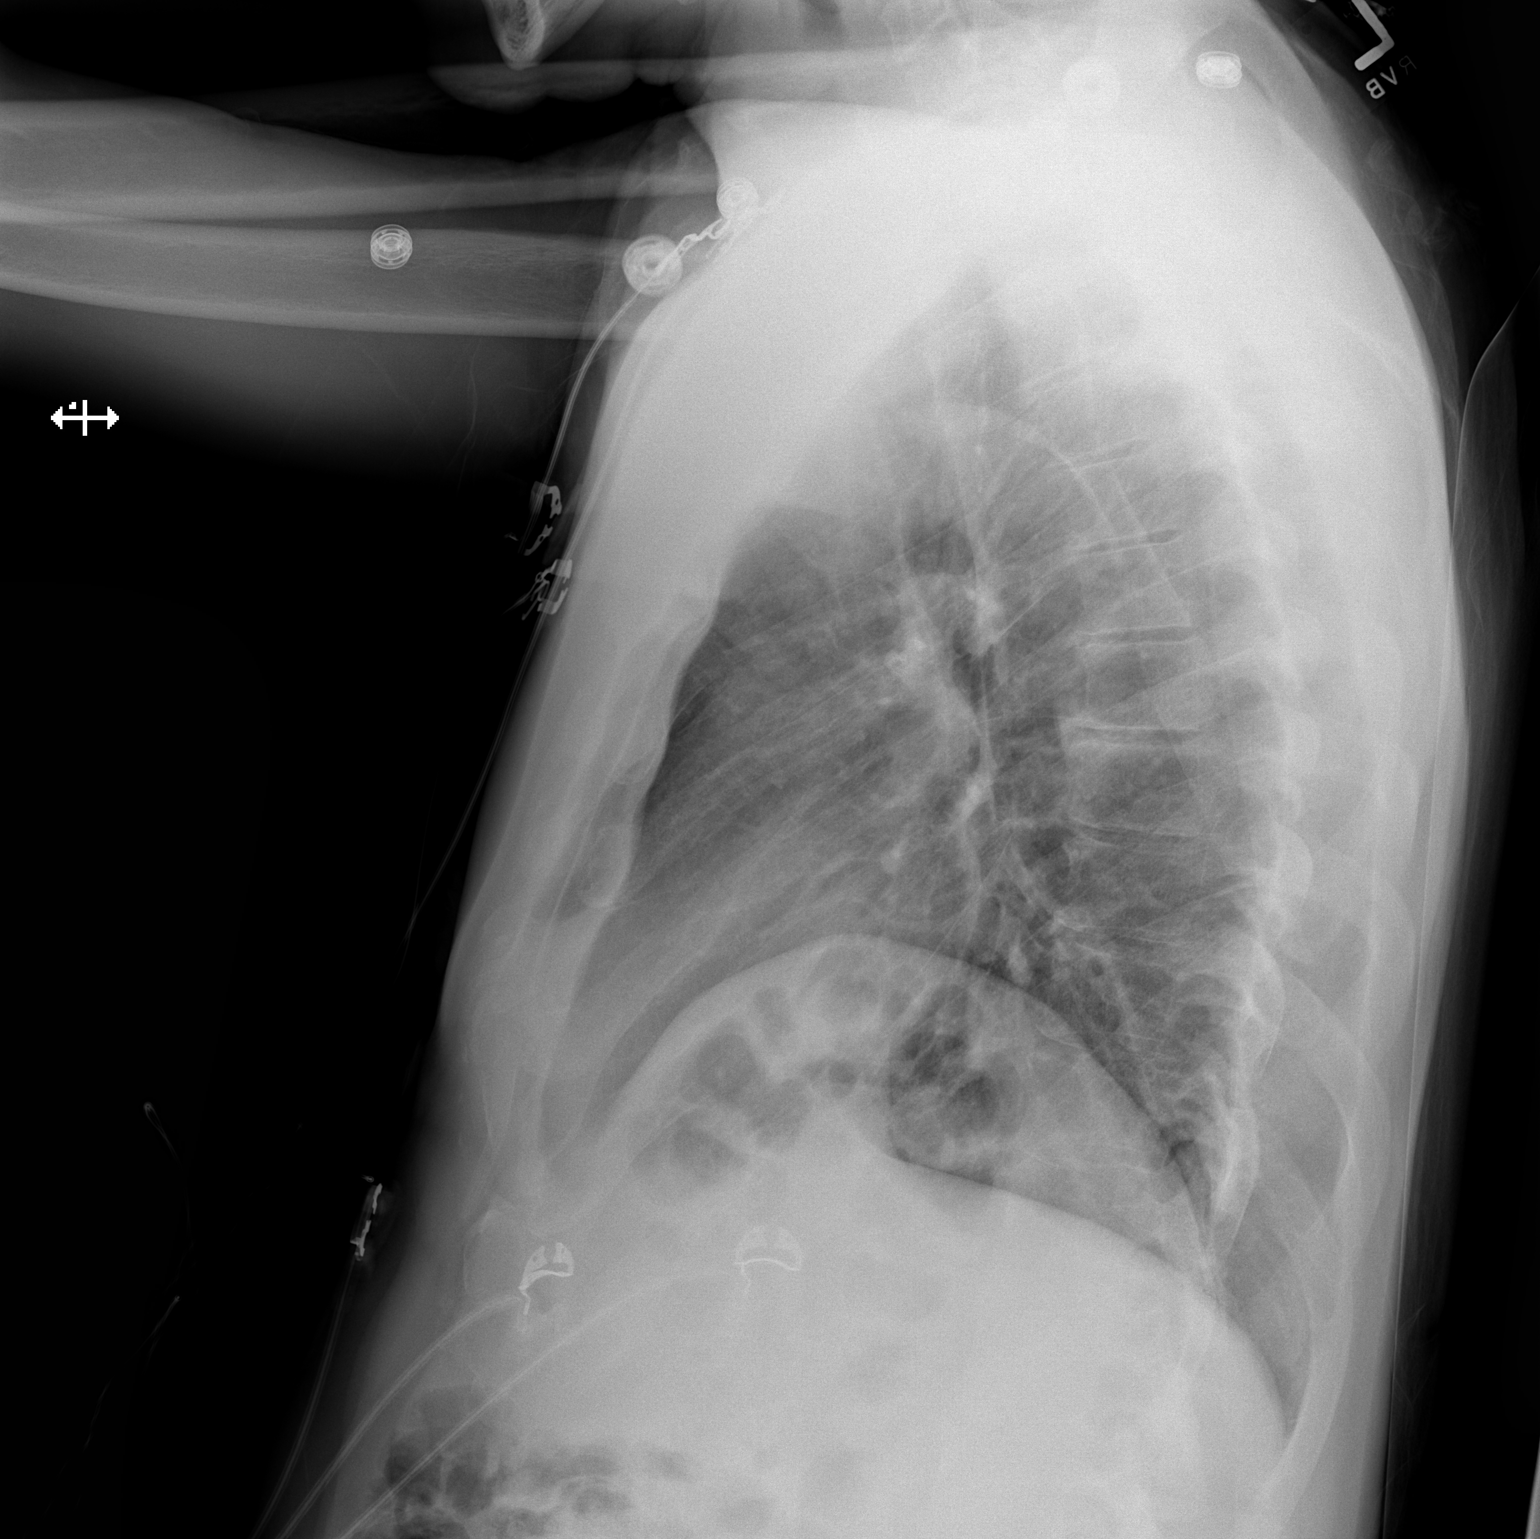

[2 of 2 positions shown; findings below may reference images not displayed]

FINDINGS: The heart size and mediastinal contours are within normal limits.
Both lungs are clear. The visualized skeletal structures are
unremarkable.
IMPRESSION: No active cardiopulmonary disease.  No significant change.

## 2017-01-29 NOTE — ED Notes (Signed)
States does not eat pork - new tray ordered for pt w/turkey sausage.
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
## 2017-01-29 NOTE — ED Provider Notes (Signed)
Pt seen and examined Denies SI Seen by psych and deemed safe for d/c Pt feels safe to go home and f/u his psych provider   Lorre NickAnthony Cordarious Zeek, MD 01/29/17 1341

## 2017-01-29 NOTE — ED Notes (Signed)
Telepsych being performed. 

## 2017-02-10 ENCOUNTER — Ambulatory Visit: Payer: 59 | Admitting: Neurology

## 2017-02-27 ENCOUNTER — Encounter (HOSPITAL_COMMUNITY): Payer: Self-pay

## 2017-02-27 DIAGNOSIS — F329 Major depressive disorder, single episode, unspecified: Secondary | ICD-10-CM | POA: Insufficient documentation

## 2017-02-27 DIAGNOSIS — Z8673 Personal history of transient ischemic attack (TIA), and cerebral infarction without residual deficits: Secondary | ICD-10-CM | POA: Insufficient documentation

## 2017-02-27 DIAGNOSIS — Z7901 Long term (current) use of anticoagulants: Secondary | ICD-10-CM | POA: Insufficient documentation

## 2017-02-27 DIAGNOSIS — Z79899 Other long term (current) drug therapy: Secondary | ICD-10-CM | POA: Insufficient documentation

## 2017-02-27 DIAGNOSIS — I10 Essential (primary) hypertension: Secondary | ICD-10-CM | POA: Insufficient documentation

## 2017-02-27 LAB — COMPREHENSIVE METABOLIC PANEL
ALBUMIN: 4 g/dL (ref 3.5–5.0)
ALK PHOS: 47 U/L (ref 38–126)
ALT: 21 U/L (ref 17–63)
ANION GAP: 8 (ref 5–15)
AST: 23 U/L (ref 15–41)
BUN: 11 mg/dL (ref 6–20)
CALCIUM: 9.3 mg/dL (ref 8.9–10.3)
CO2: 27 mmol/L (ref 22–32)
CREATININE: 0.92 mg/dL (ref 0.61–1.24)
Chloride: 105 mmol/L (ref 101–111)
GFR calc Af Amer: >60 mL/min (ref >60)
GFR calc non Af Amer: >60 mL/min (ref >60)
GLUCOSE: 91 mg/dL (ref 65–99)
Potassium: 3.6 mmol/L (ref 3.5–5.1)
SODIUM: 140 mmol/L (ref 135–145)
Total Bilirubin: 0.7 mg/dL (ref 0.3–1.2)
Total Protein: 6.8 g/dL (ref 6.5–8.1)

## 2017-02-27 LAB — CBC
HEMATOCRIT: 37.3 % — AB (ref 39.0–52.0)
Hemoglobin: 12.6 g/dL — ABNORMAL LOW (ref 13.0–17.0)
MCH: 32.6 pg (ref 26.0–34.0)
MCHC: 33.8 g/dL (ref 30.0–36.0)
MCV: 96.6 fL (ref 78.0–100.0)
Platelets: 242 10*3/uL (ref 150–400)
RBC: 3.86 MIL/uL — AB (ref 4.22–5.81)
RDW: 14.1 % (ref 11.5–15.5)
WBC: 6.5 10*3/uL (ref 4.0–10.5)

## 2017-02-27 LAB — RAPID URINE DRUG SCREEN, HOSP PERFORMED
AMPHETAMINES: NOT DETECTED
BARBITURATES: NOT DETECTED
Benzodiazepines: NOT DETECTED
COCAINE: NOT DETECTED
Opiates: NOT DETECTED
TETRAHYDROCANNABINOL: NOT DETECTED

## 2017-02-27 LAB — SALICYLATE LEVEL: Salicylate Lvl: <7 mg/dL (ref 2.8–30.0)

## 2017-02-27 LAB — ACETAMINOPHEN LEVEL

## 2017-02-27 LAB — ETHANOL: Alcohol, Ethyl (B): <5 mg/dL (ref <5)

## 2017-02-27 NOTE — ED Triage Notes (Signed)
PT reports feeling suicidal x 3 days. He states he has been thinking of jumping in front of a car. Pt calm in triage. NAD.   Endorses AH and VH.

## 2017-02-28 ENCOUNTER — Emergency Department (HOSPITAL_COMMUNITY)
Admission: EM | Admit: 2017-02-28 | Discharge: 2017-02-28 | Disposition: A | Discharge status: Other Acute Inpatient Hospital | Payer: Self-pay

## 2017-02-28 DIAGNOSIS — R45851 Suicidal ideations: Secondary | ICD-10-CM

## 2017-02-28 MED ORDER — TRAZODONE HCL 100 MG PO TABS: 100.0000 mg | ORAL_TABLET | Freq: Every day | ORAL | Status: DC

## 2017-02-28 MED ORDER — FLUOXETINE HCL 20 MG PO CAPS
20.0000 mg | ORAL_CAPSULE | Freq: Every day | ORAL | Status: DC
  Administered 2017-02-28: 20 mg via ORAL
  Filled 2017-02-28: qty 1

## 2017-02-28 MED ORDER — OXYCODONE HCL 5 MG PO TABS
5.0000 mg | ORAL_TABLET | Freq: Once | ORAL | Status: AC
  Administered 2017-02-28: 5 mg via ORAL
  Filled 2017-02-28: qty 1

## 2017-02-28 MED ORDER — RIVAROXABAN 20 MG PO TABS
20.0000 mg | ORAL_TABLET | Freq: Every day | ORAL | Status: DC
  Filled 2017-02-28: qty 1

## 2017-02-28 MED ORDER — HYDROXYZINE HCL 25 MG PO TABS
25.0000 mg | ORAL_TABLET | Freq: Four times a day (QID) | ORAL | Status: DC | PRN
  Administered 2017-02-28: 25 mg via ORAL
  Filled 2017-02-28: qty 1
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
## 2017-02-28 NOTE — ED Notes (Signed)
Pt given Vistaril as requested for anxiety.

## 2017-02-28 NOTE — ED Notes (Signed)
Pt belongings inventoried locked in room and phone sent to secuirty

## 2017-02-28 NOTE — ED Notes (Signed)
Snack provided and lunch tray ordered. 

## 2017-02-28 NOTE — Progress Notes (Signed)
Per Karleen Hampshire, [place] meets inpatient criteria Tanveer Brammer K. Sherlon Handing, LPC-A, [facility]  Counselor 02/28/2017 3:04 AM

## 2017-02-28 NOTE — ED Notes (Addendum)
[facility] called and advised accepting pt - Dr Carolyn Stare - report number - [phone]. Advised bed is ready now and may call report. Julieanne Cotton, [facility], aware.

## 2017-02-28 NOTE — Progress Notes (Signed)
Pt meets criteria for inpt treatment. CSW faxed referrals to the following inpt facilities for review:  [facility] [address] [address], [place], [facility], [address], [place], and [place].   TTS will continue to seek bed placement.  Princess Bruins, LCSWA Clinical Social Work [phone]
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
## 2017-02-28 NOTE — ED Notes (Signed)
Pt aware accepted to [facility] and voiced agreement w/tx plan. Attempted to call report - [place] advised bed is not ready and to call report when pt is en route. Spoke w/Sedrick, who is in Intake and he advised pt has been accepted and the bed is ready and to call report when pt is en route. Advised may transport now - and not to wait.

## 2017-02-28 NOTE — ED Notes (Signed)
Called for sitter ?

## 2017-02-28 NOTE — ED Notes (Signed)
Pt meets inpatient criteria  

## 2017-02-28 NOTE — ED Notes (Signed)
Called security to wand patients.

## 2017-03-11 ENCOUNTER — Encounter (HOSPITAL_COMMUNITY): Payer: Self-pay | Admitting: Emergency Medicine

## 2017-03-11 ENCOUNTER — Emergency Department (HOSPITAL_COMMUNITY)
Admission: EM | Admit: 2017-03-11 | Discharge: 2017-03-11 | Disposition: A | Discharge status: Home / Self Care | Payer: Self-pay | Attending: Emergency Medicine | Admitting: Emergency Medicine

## 2017-03-11 DIAGNOSIS — Z8673 Personal history of transient ischemic attack (TIA), and cerebral infarction without residual deficits: Secondary | ICD-10-CM | POA: Insufficient documentation

## 2017-03-11 DIAGNOSIS — Z79899 Other long term (current) drug therapy: Secondary | ICD-10-CM | POA: Insufficient documentation

## 2017-03-11 DIAGNOSIS — R45851 Suicidal ideations: Secondary | ICD-10-CM

## 2017-03-11 DIAGNOSIS — Z7902 Long term (current) use of antithrombotics/antiplatelets: Secondary | ICD-10-CM | POA: Insufficient documentation

## 2017-03-11 DIAGNOSIS — I10 Essential (primary) hypertension: Secondary | ICD-10-CM | POA: Insufficient documentation

## 2017-03-11 DIAGNOSIS — F341 Dysthymic disorder: Secondary | ICD-10-CM | POA: Insufficient documentation

## 2017-03-11 LAB — COMPREHENSIVE METABOLIC PANEL
ALBUMIN: 4.2 g/dL (ref 3.5–5.0)
ALT: 39 U/L (ref 17–63)
ANION GAP: 8 (ref 5–15)
AST: 33 U/L (ref 15–41)
Alkaline Phosphatase: 44 U/L (ref 38–126)
BILIRUBIN TOTAL: 0.9 mg/dL (ref 0.3–1.2)
BUN: 11 mg/dL (ref 6–20)
CO2: 28 mmol/L (ref 22–32)
Calcium: 8.8 mg/dL — ABNORMAL LOW (ref 8.9–10.3)
Chloride: 101 mmol/L (ref 101–111)
Creatinine, Ser: 1.07 mg/dL (ref 0.61–1.24)
GFR calc Af Amer: >60 mL/min (ref >60)
Glucose, Bld: 95 mg/dL (ref 65–99)
POTASSIUM: 3.9 mmol/L (ref 3.5–5.1)
Sodium: 137 mmol/L (ref 135–145)
TOTAL PROTEIN: 7.3 g/dL (ref 6.5–8.1)

## 2017-03-11 LAB — RAPID URINE DRUG SCREEN, HOSP PERFORMED
Amphetamines: NOT DETECTED
BENZODIAZEPINES: NOT DETECTED
Barbiturates: NOT DETECTED
COCAINE: NOT DETECTED
Opiates: NOT DETECTED
Tetrahydrocannabinol: NOT DETECTED

## 2017-03-11 LAB — CBC
HCT: 39.3 % (ref 39.0–52.0)
Hemoglobin: 13.2 g/dL (ref 13.0–17.0)
MCH: 31.8 pg (ref 26.0–34.0)
MCHC: 33.6 g/dL (ref 30.0–36.0)
MCV: 94.7 fL (ref 78.0–100.0)
PLATELETS: 231 10*3/uL (ref 150–400)
RBC: 4.15 MIL/uL — ABNORMAL LOW (ref 4.22–5.81)
RDW: 13.8 % (ref 11.5–15.5)
WBC: 6 10*3/uL (ref 4.0–10.5)

## 2017-03-11 LAB — ACETAMINOPHEN LEVEL

## 2017-03-11 LAB — SALICYLATE LEVEL

## 2017-03-11 LAB — ETHANOL: ALCOHOL ETHYL (B): 198 mg/dL — AB (ref <5)

## 2017-03-11 MED ORDER — TRAZODONE HCL 100 MG PO TABS: 100.0000 mg | ORAL_TABLET | Freq: Every day | ORAL | Status: DC

## 2017-03-11 MED ORDER — RIVAROXABAN 20 MG PO TABS
20.0000 mg | ORAL_TABLET | Freq: Every day | ORAL | Status: DC
  Filled 2017-03-11: qty 1

## 2017-03-11 MED ORDER — FLUOXETINE HCL 20 MG PO CAPS
20.0000 mg | ORAL_CAPSULE | Freq: Every day | ORAL | Status: DC
  Administered 2017-03-11: 20 mg via ORAL
  Filled 2017-03-11: qty 1

## 2017-03-11 MED ORDER — HYDROXYZINE HCL 25 MG PO TABS: 25.0000 mg | ORAL_TABLET | Freq: Four times a day (QID) | ORAL | Status: DC | PRN

## 2017-03-11 NOTE — ED Notes (Signed)
Pt pleasant.  Interacting with staff.

## 2017-03-11 NOTE — ED Notes (Signed)
A regular lunch was ordered for lunch.
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
## 2017-03-11 NOTE — ED Notes (Signed)
ALL belongings returned to pt - 1 labeled belongings bag - pt signed verifying all items present.

## 2017-03-11 NOTE — ED Notes (Signed)
Pt signed "No Harm Contract" - copy given to pt - copy sent to Medical Records. Pt states has appt w/Dr Chilton Si for 03/13/17 - voiced understanding to obtain new rx's then as per Dr Adela Lank.

## 2017-03-11 NOTE — ED Notes (Signed)
Verified with staffing that they are aware of patient's need for sitter. House sitter coming at [employer]
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
## 2017-03-11 NOTE — ED Notes (Signed)
Patient was given Water, A Coke and Pack of Cookie.

## 2017-03-11 NOTE — BH Assessment (Signed)
Per Assunta Found, NP - patient does meets criteria for inpatient hospitalization.  Patient will be able to follow up with his established provider at [facility].  Writer informed the ER MD Kriste Basque).  Kriste Basque reports that she will inform the ER MD of the patient's disposition.
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
## 2017-03-11 NOTE — ED Notes (Signed)
Patient Lunch at bedside, Patient is encouraged to eat Lunch.
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
## 2017-03-13 ENCOUNTER — Inpatient Hospital Stay: Payer: Self-pay

## 2017-03-17 ENCOUNTER — Emergency Department (HOSPITAL_COMMUNITY)
Admission: EM | Admit: 2017-03-17 | Discharge: 2017-03-20 | Disposition: A | Discharge status: Home / Self Care | Payer: Self-pay | Attending: Emergency Medicine | Admitting: Emergency Medicine

## 2017-03-17 ENCOUNTER — Emergency Department (HOSPITAL_COMMUNITY): Admit: 2017-03-17 | Payer: Self-pay

## 2017-03-17 ENCOUNTER — Emergency Department (HOSPITAL_BASED_OUTPATIENT_CLINIC_OR_DEPARTMENT_OTHER): Admit: 2017-03-17 | Discharge: 2017-03-17 | Disposition: A | Discharge status: Home / Self Care | Payer: Self-pay

## 2017-03-17 ENCOUNTER — Encounter (HOSPITAL_COMMUNITY): Payer: Self-pay | Admitting: Emergency Medicine

## 2017-03-17 DIAGNOSIS — I10 Essential (primary) hypertension: Secondary | ICD-10-CM | POA: Insufficient documentation

## 2017-03-17 DIAGNOSIS — R45851 Suicidal ideations: Secondary | ICD-10-CM

## 2017-03-17 DIAGNOSIS — Z79899 Other long term (current) drug therapy: Secondary | ICD-10-CM | POA: Insufficient documentation

## 2017-03-17 DIAGNOSIS — Z7901 Long term (current) use of anticoagulants: Secondary | ICD-10-CM | POA: Insufficient documentation

## 2017-03-17 DIAGNOSIS — F330 Major depressive disorder, recurrent, mild: Secondary | ICD-10-CM | POA: Insufficient documentation

## 2017-03-17 DIAGNOSIS — I82409 Acute embolism and thrombosis of unspecified deep veins of unspecified lower extremity: Secondary | ICD-10-CM

## 2017-03-17 DIAGNOSIS — Z8673 Personal history of transient ischemic attack (TIA), and cerebral infarction without residual deficits: Secondary | ICD-10-CM | POA: Insufficient documentation

## 2017-03-17 LAB — COMPREHENSIVE METABOLIC PANEL
ALBUMIN: 3.6 g/dL (ref 3.5–5.0)
ALT: 22 U/L (ref 17–63)
AST: 19 U/L (ref 15–41)
Alkaline Phosphatase: 45 U/L (ref 38–126)
Anion gap: 8 (ref 5–15)
BILIRUBIN TOTAL: 0.8 mg/dL (ref 0.3–1.2)
BUN: 15 mg/dL (ref 6–20)
CO2: 27 mmol/L (ref 22–32)
Calcium: 8.7 mg/dL — ABNORMAL LOW (ref 8.9–10.3)
Chloride: 105 mmol/L (ref 101–111)
Creatinine, Ser: 0.91 mg/dL (ref 0.61–1.24)
GFR calc Af Amer: >60 mL/min (ref >60)
GFR calc non Af Amer: >60 mL/min (ref >60)
GLUCOSE: 102 mg/dL — AB (ref 65–99)
POTASSIUM: 4.1 mmol/L (ref 3.5–5.1)
Sodium: 140 mmol/L (ref 135–145)
Total Protein: 6.6 g/dL (ref 6.5–8.1)

## 2017-03-17 LAB — CBC WITH DIFFERENTIAL/PLATELET
Basophils Absolute: 0 10*3/uL (ref 0.0–0.1)
Basophils Relative: 0 %
Eosinophils Absolute: 0.2 10*3/uL (ref 0.0–0.7)
Eosinophils Relative: 5 %
HEMATOCRIT: 39.8 % (ref 39.0–52.0)
Hemoglobin: 13.3 g/dL (ref 13.0–17.0)
LYMPHS PCT: 42 %
Lymphs Abs: 2 10*3/uL (ref 0.7–4.0)
MCH: 32 pg (ref 26.0–34.0)
MCHC: 33.4 g/dL (ref 30.0–36.0)
MCV: 95.7 fL (ref 78.0–100.0)
MONO ABS: 0.2 10*3/uL (ref 0.1–1.0)
Monocytes Relative: 5 %
NEUTROS ABS: 2.3 10*3/uL (ref 1.7–7.7)
Neutrophils Relative %: 48 %
Platelets: 226 10*3/uL (ref 150–400)
RBC: 4.16 MIL/uL — ABNORMAL LOW (ref 4.22–5.81)
RDW: 13.7 % (ref 11.5–15.5)
WBC: 4.8 10*3/uL (ref 4.0–10.5)

## 2017-03-17 LAB — ETHANOL: Alcohol, Ethyl (B): <5 mg/dL (ref <5)

## 2017-03-17 LAB — RAPID URINE DRUG SCREEN, HOSP PERFORMED
Amphetamines: NOT DETECTED
BARBITURATES: NOT DETECTED
Benzodiazepines: NOT DETECTED
Cocaine: NOT DETECTED
Opiates: NOT DETECTED
Tetrahydrocannabinol: NOT DETECTED

## 2017-03-17 LAB — ACETAMINOPHEN LEVEL

## 2017-03-17 LAB — SALICYLATE LEVEL: Salicylate Lvl: <7 mg/dL (ref 2.8–30.0)

## 2017-03-17 MED ORDER — RIVAROXABAN 20 MG PO TABS
20.0000 mg | ORAL_TABLET | Freq: Every day | ORAL | Status: DC
  Administered 2017-03-18 – 2017-03-19 (×2): 20 mg via ORAL
  Filled 2017-03-17 (×2): qty 1

## 2017-03-17 MED ORDER — QUETIAPINE FUMARATE 50 MG PO TABS
50.0000 mg | ORAL_TABLET | Freq: Every day | ORAL | Status: DC
  Administered 2017-03-17: 50 mg via ORAL
  Filled 2017-03-17: qty 1

## 2017-03-17 MED ORDER — OXYCODONE HCL 5 MG PO TABS
5.0000 mg | ORAL_TABLET | Freq: Two times a day (BID) | ORAL | Status: DC | PRN
  Administered 2017-03-17 – 2017-03-19 (×4): 5 mg via ORAL
  Filled 2017-03-17 (×4): qty 1

## 2017-03-17 MED ORDER — TRAZODONE HCL 100 MG PO TABS
100.0000 mg | ORAL_TABLET | Freq: Every day | ORAL | Status: DC
  Administered 2017-03-17: 100 mg via ORAL
  Filled 2017-03-17: qty 1

## 2017-03-17 MED ORDER — HYDROXYZINE HCL 25 MG PO TABS: 25.0000 mg | ORAL_TABLET | Freq: Four times a day (QID) | ORAL | Status: DC | PRN

## 2017-03-17 MED ORDER — FLUOXETINE HCL 20 MG PO TABS
20.0000 mg | ORAL_TABLET | Freq: Every day | ORAL | Status: DC
  Filled 2017-03-17: qty 1

## 2017-03-17 NOTE — ED Notes (Signed)
Introduced self to patient. Pt oriented to unit expectations.  Assessed pt for:  A) Anxiety &/or agitation: On admission to the [facility] pt is calm and cooperative. He said that he does not like the word, "suicide", but he does admit to thoughts of wanting to kill himself and said that it comes from his anxiety.   S) Safety: Safety maintained with q-15-minute checks and hourly rounds by staff.  A) ADLs: Pt able to perform ADLs independently.  P) Pick-Up (room cleanliness): Pt's room clean and free of clutter.
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
## 2017-03-17 NOTE — BH Assessment (Signed)
Unable to close [facility] assessment note on patient. IT contacted with request for help. Ticket in place. Waiting for call back.

## 2017-03-17 NOTE — ED Notes (Signed)
SBAR Report received from previous nurse. Pt received calm and visible on unit. Pt resting and denies current SI/ HI, A/V H, depression, anxiety, or pain at this time, and appears otherwise stable and free of distress. Pt reminded of camera surveillance, q 15 min rounds, and rules of the milieu. Will continue to assess. 

## 2017-03-17 NOTE — Progress Notes (Signed)
*  PRELIMINARY RESULTS* Vascular Ultrasound Right lower extremity venous duplex has been completed.  Preliminary findings: No evidence of DVT or baker's cyst.   Farrel Demark, RDMS, RVT  03/17/2017, 3:59 PM

## 2017-03-17 NOTE — ED Triage Notes (Signed)
Pt BIB [facility] after being IVD'c by [facility]. According to IVC paperwork pt has a long hx of depression and has lost his medications 1 week ago. Pt endorses auditory and visual hallucinations with impulsive suicidal thoughts. Pt does not feel safe and requires further medical treatment. Last suicidal attempt was in 2017, when pt walked in front of a car.
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
## 2017-03-17 NOTE — ED Notes (Signed)
Bed: WA28 Expected date:  Expected time:  Means of arrival:  Comments: 

## 2017-03-18 DIAGNOSIS — F333 Major depressive disorder, recurrent, severe with psychotic symptoms: Secondary | ICD-10-CM

## 2017-03-18 DIAGNOSIS — R45851 Suicidal ideations: Secondary | ICD-10-CM

## 2017-03-18 MED ORDER — TRAZODONE HCL 50 MG PO TABS
50.0000 mg | ORAL_TABLET | Freq: Every evening | ORAL | Status: DC | PRN
  Administered 2017-03-18 – 2017-03-19 (×2): 50 mg via ORAL
  Filled 2017-03-18 (×2): qty 1

## 2017-03-18 MED ORDER — QUETIAPINE FUMARATE 100 MG PO TABS
100.0000 mg | ORAL_TABLET | Freq: Every day | ORAL | Status: DC
  Administered 2017-03-18: 100 mg via ORAL
  Filled 2017-03-18: qty 1

## 2017-03-18 MED ORDER — FLUOXETINE HCL 20 MG PO CAPS
20.0000 mg | ORAL_CAPSULE | Freq: Every day | ORAL | Status: DC
  Administered 2017-03-18 – 2017-03-20 (×3): 20 mg via ORAL
  Filled 2017-03-18 (×3): qty 1

## 2017-03-18 NOTE — ED Notes (Signed)
Pt A&O x 3, no distress noted, calm & cooperative.  Watching TV at present.  Monitoring for safety, Q 15 min checks in effect. 
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
## 2017-03-19 MED ORDER — QUETIAPINE FUMARATE 100 MG PO TABS
100.0000 mg | ORAL_TABLET | Freq: Every day | ORAL | Status: DC
  Administered 2017-03-19 (×2): 100 mg via ORAL
  Filled 2017-03-19 (×2): qty 1

## 2017-03-19 MED ORDER — QUETIAPINE FUMARATE 50 MG PO TABS: 50.0000 mg | ORAL_TABLET | Freq: Once | ORAL | Status: DC

## 2017-03-19 NOTE — BH Assessment (Signed)
Faxed clinical information to the following facilities for placement:  [facility]    [address] [place], [facility], [facility], [facility] Triage Specialist [phone]

## 2017-03-19 NOTE — ED Notes (Signed)
Up to th bathroom

## 2017-03-19 NOTE — ED Notes (Signed)
TTS into see 

## 2017-03-19 NOTE — ED Notes (Signed)
Up to the bathroom 

## 2017-03-19 NOTE — ED Notes (Signed)
SBAR Report received from previous nurse. Pt received calm and visible on unit. Pt denies current SI/ HI, A/V H, at this time, but endorses that he is depressed and anxious at times. and appears otherwise stable and free of distress. Pt reminded of camera surveillance, q 15 min rounds, and rules of the milieu. Will continue to assess.

## 2017-03-19 NOTE — BH Assessment (Signed)
Reassessment:   Writer reassessed patient on this day. He sts, "I don't feel any better today". He continues to endorse suicidal thoughts with a plan to jump in traffic or off a bridge. He has attempted suicide in the past by walking in front of a car. Patient sts that he is unable to contract for safety today. He denies homicidal thoughts. He hears auditory hallucinations of "weird unds" that he is unable to make out. Patient is calm and cooperative. Per. Dr. Jama Flavors, patient continues to meet criteria for INPT treatment.

## 2017-03-20 DIAGNOSIS — F330 Major depressive disorder, recurrent, mild: Secondary | ICD-10-CM

## 2017-03-20 MED ORDER — FLUOXETINE HCL 20 MG PO TABS: 20.0000 mg | ORAL_TABLET | Freq: Every day | ORAL | 0 refills | Status: DC

## 2017-03-20 MED ORDER — QUETIAPINE FUMARATE 50 MG PO TABS: 50.0000 mg | ORAL_TABLET | Freq: Every day | ORAL | 0 refills | Status: DC

## 2017-03-20 MED ORDER — HYDROXYZINE HCL 25 MG PO TABS: 25.0000 mg | ORAL_TABLET | Freq: Four times a day (QID) | ORAL | 0 refills | Status: DC | PRN

## 2017-03-20 NOTE — ED Notes (Signed)
Pt discharged ambulatory with resources and prescriptions.  RX and discharge instructions were reviewed.  All belongings were returned to pt.

## 2017-03-20 NOTE — BH Assessment (Signed)
[facility] Assessment Progress Note  Per Thedore Mins, MD, this pt does not require psychiatric hospitalization at this time.  Pt presents under IVC initiated by Ivin Poot, MD at [facility], which Dr Jannifer Franklin has rescinded.  Pt is to be discharged from [facility] with recommendation to follow up with [facility].  This has been included in pt's discharge instructions.  Pt's nurse, Kendal Hymen, has been notified.  Doylene Canning, MA Triage Specialist [phone]

## 2017-03-20 NOTE — Discharge Instructions (Addendum)
For your ongoing mental health needs, you are advised to follow up with [name].  If you do not currently have an appointment, new and returning patients are seen at their walk-in clinic.  Walk-in hours are Monday - Friday from 8:00 am - 3:00 pm.  Walk-in patients are seen on a first come, first served basis.  Try to arrive as early as possible for he best chance of being seen the same day: ° °     [name] °     [address] °     [place], [place] [address] °     [phone] °
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
## 2017-03-24 ENCOUNTER — Encounter: Payer: Self-pay | Admitting: Pediatric Intensive Care

## 2017-03-24 MED FILL — QUETIAPINE FUMARATE 50 MG T: 50 | 30 days supply | Qty: 120 | Fill #0

## 2017-03-24 MED FILL — FLUoxetine HCL 20 MG CAPS: 20 | 30 days supply | Qty: 30 | Fill #0

## 2017-03-24 MED FILL — hydrOXYzine HCL 25 MG TABS: 25 | 8 days supply | Qty: 30 | Fill #0

## 2017-04-07 ENCOUNTER — Encounter: Payer: Self-pay | Admitting: Pediatric Intensive Care

## 2017-04-07 ENCOUNTER — Emergency Department (HOSPITAL_COMMUNITY)
Admission: EM | Admit: 2017-04-07 | Discharge: 2017-04-07 | Disposition: A | Discharge status: Home / Self Care | Payer: Self-pay | Attending: Emergency Medicine | Admitting: Emergency Medicine

## 2017-04-07 ENCOUNTER — Encounter (HOSPITAL_COMMUNITY): Payer: Self-pay

## 2017-04-07 ENCOUNTER — Emergency Department (HOSPITAL_COMMUNITY): Payer: Self-pay

## 2017-04-07 DIAGNOSIS — Z8673 Personal history of transient ischemic attack (TIA), and cerebral infarction without residual deficits: Secondary | ICD-10-CM | POA: Insufficient documentation

## 2017-04-07 DIAGNOSIS — M79604 Pain in right leg: Secondary | ICD-10-CM | POA: Insufficient documentation

## 2017-04-07 DIAGNOSIS — Z91010 Allergy to peanuts: Secondary | ICD-10-CM | POA: Insufficient documentation

## 2017-04-07 DIAGNOSIS — R079 Chest pain, unspecified: Secondary | ICD-10-CM | POA: Insufficient documentation

## 2017-04-07 DIAGNOSIS — Z79899 Other long term (current) drug therapy: Secondary | ICD-10-CM | POA: Insufficient documentation

## 2017-04-07 DIAGNOSIS — I10 Essential (primary) hypertension: Secondary | ICD-10-CM | POA: Insufficient documentation

## 2017-04-07 DIAGNOSIS — G8929 Other chronic pain: Secondary | ICD-10-CM | POA: Insufficient documentation

## 2017-04-07 LAB — CBC WITH DIFFERENTIAL/PLATELET
Basophils Absolute: 0 10*3/uL (ref 0.0–0.1)
Basophils Relative: 0 %
EOS ABS: 0.1 10*3/uL (ref 0.0–0.7)
Eosinophils Relative: 2 %
HCT: 37.6 % — ABNORMAL LOW (ref 39.0–52.0)
HEMOGLOBIN: 12.8 g/dL — AB (ref 13.0–17.0)
LYMPHS ABS: 2.5 10*3/uL (ref 0.7–4.0)
Lymphocytes Relative: 47 %
MCH: 31.6 pg (ref 26.0–34.0)
MCHC: 34 g/dL (ref 30.0–36.0)
MCV: 92.8 fL (ref 78.0–100.0)
MONOS PCT: 4 %
Monocytes Absolute: 0.2 10*3/uL (ref 0.1–1.0)
NEUTROS PCT: 47 %
Neutro Abs: 2.5 10*3/uL (ref 1.7–7.7)
Platelets: 226 10*3/uL (ref 150–400)
RBC: 4.05 MIL/uL — AB (ref 4.22–5.81)
RDW: 13.8 % (ref 11.5–15.5)
WBC: 5.3 10*3/uL (ref 4.0–10.5)

## 2017-04-07 LAB — COMPREHENSIVE METABOLIC PANEL
ALT: 20 U/L (ref 17–63)
AST: 26 U/L (ref 15–41)
Albumin: 4 g/dL (ref 3.5–5.0)
Alkaline Phosphatase: 52 U/L (ref 38–126)
Anion gap: 8 (ref 5–15)
BUN: 8 mg/dL (ref 6–20)
CHLORIDE: 105 mmol/L (ref 101–111)
CO2: 25 mmol/L (ref 22–32)
CREATININE: 0.88 mg/dL (ref 0.61–1.24)
Calcium: 8.6 mg/dL — ABNORMAL LOW (ref 8.9–10.3)
Glucose, Bld: 91 mg/dL (ref 65–99)
POTASSIUM: 3.4 mmol/L — AB (ref 3.5–5.1)
SODIUM: 138 mmol/L (ref 135–145)
Total Bilirubin: 0.7 mg/dL (ref 0.3–1.2)
Total Protein: 7 g/dL (ref 6.5–8.1)

## 2017-04-07 LAB — D-DIMER, QUANTITATIVE: D-Dimer, Quant: 0.28 ug/mL-FEU (ref 0.00–0.50)

## 2017-04-07 LAB — I-STAT TROPONIN, ED: TROPONIN I, POC: 0 ng/mL (ref 0.00–0.08)

## 2017-04-07 LAB — PROTIME-INR
INR: 1.01
PROTHROMBIN TIME: 13.3 s (ref 11.4–15.2)

## 2017-04-07 MED ORDER — TRAMADOL HCL 50 MG PO TABS: 50.0000 mg | ORAL_TABLET | Freq: Four times a day (QID) | ORAL | 0 refills | Status: DC | PRN

## 2017-04-07 NOTE — ED Triage Notes (Signed)
Per Pt, Pt is coming into ED with complaints of right leg pain that started today after constant walking. Some swelling noted to the leg. Pt has Hx of DVT. Reports not taking Xarelto sine 5/4. Vitals per EMS: 128/82, 90 HR

## 2017-04-07 NOTE — Discharge Instructions (Signed)
Medications: Tramadol  Treatment: Take tramadol every 6 hours as needed for ear pain. You can alternate with Tylenol or ibuprofen, or Aleve as prescribed over-the-counter.  Follow-up: Please follow-up with your primary care provider for further evaluation and treatment of your symptoms today. Please return to emergency department if you develop any new or worsening symptoms.

## 2017-04-07 NOTE — ED Notes (Signed)
RN called lab to confirm add on of d dimer.

## 2017-04-07 NOTE — ED Notes (Signed)
PT reports chest pain sharp in nature for 2 days that he takes nitro for. MD informed

## 2017-04-07 NOTE — ED Triage Notes (Signed)
Pt has mass noted to the right groin and reports some pain with palpation.
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
## 2017-04-11 ENCOUNTER — Encounter: Payer: Self-pay | Admitting: Pediatric Intensive Care
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
## 2017-05-09 ENCOUNTER — Ambulatory Visit (INDEPENDENT_AMBULATORY_CARE_PROVIDER_SITE_OTHER): Payer: Self-pay | Admitting: Physician Assistant
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
## 2017-06-25 ENCOUNTER — Emergency Department (HOSPITAL_COMMUNITY)
Admission: EM | Admit: 2017-06-25 | Discharge: 2017-06-26 | Disposition: A | Discharge status: Home / Self Care | Payer: Self-pay | Attending: Emergency Medicine | Admitting: Emergency Medicine

## 2017-06-25 ENCOUNTER — Emergency Department (HOSPITAL_COMMUNITY): Payer: Self-pay

## 2017-06-25 ENCOUNTER — Encounter (HOSPITAL_COMMUNITY): Payer: Self-pay | Admitting: Emergency Medicine

## 2017-06-25 DIAGNOSIS — R55 Syncope and collapse: Secondary | ICD-10-CM | POA: Insufficient documentation

## 2017-06-25 DIAGNOSIS — I10 Essential (primary) hypertension: Secondary | ICD-10-CM | POA: Insufficient documentation

## 2017-06-25 DIAGNOSIS — Z8673 Personal history of transient ischemic attack (TIA), and cerebral infarction without residual deficits: Secondary | ICD-10-CM | POA: Insufficient documentation

## 2017-06-25 DIAGNOSIS — R0789 Other chest pain: Secondary | ICD-10-CM | POA: Insufficient documentation

## 2017-06-25 DIAGNOSIS — R079 Chest pain, unspecified: Secondary | ICD-10-CM

## 2017-06-25 DIAGNOSIS — Z9114 Patient's other noncompliance with medication regimen: Secondary | ICD-10-CM | POA: Insufficient documentation

## 2017-06-25 DIAGNOSIS — R0602 Shortness of breath: Secondary | ICD-10-CM | POA: Insufficient documentation

## 2017-06-25 DIAGNOSIS — R110 Nausea: Secondary | ICD-10-CM | POA: Insufficient documentation

## 2017-06-25 DIAGNOSIS — Z86711 Personal history of pulmonary embolism: Secondary | ICD-10-CM | POA: Insufficient documentation

## 2017-06-25 LAB — CBC
HCT: 38.9 % — ABNORMAL LOW (ref 39.0–52.0)
Hemoglobin: 13.2 g/dL (ref 13.0–17.0)
MCH: 31.4 pg (ref 26.0–34.0)
MCHC: 33.9 g/dL (ref 30.0–36.0)
MCV: 92.4 fL (ref 78.0–100.0)
Platelets: 246 10*3/uL (ref 150–400)
RBC: 4.21 MIL/uL — ABNORMAL LOW (ref 4.22–5.81)
RDW: 14.9 % (ref 11.5–15.5)
WBC: 7.8 10*3/uL (ref 4.0–10.5)

## 2017-06-25 LAB — I-STAT TROPONIN, ED: TROPONIN I, POC: 0 ng/mL (ref 0.00–0.08)

## 2017-06-25 LAB — BASIC METABOLIC PANEL
ANION GAP: 13 (ref 5–15)
BUN: 19 mg/dL (ref 6–20)
CALCIUM: 8.7 mg/dL — AB (ref 8.9–10.3)
CO2: 20 mmol/L — AB (ref 22–32)
CREATININE: 1.21 mg/dL (ref 0.61–1.24)
Chloride: 104 mmol/L (ref 101–111)
GFR calc Af Amer: >60 mL/min (ref >60)
GLUCOSE: 104 mg/dL — AB (ref 65–99)
Potassium: 3.3 mmol/L — ABNORMAL LOW (ref 3.5–5.1)
Sodium: 137 mmol/L (ref 135–145)

## 2017-06-25 MED ORDER — IOPAMIDOL (ISOVUE-370) INJECTION 76%
100.0000 mL | Freq: Once | INTRAVENOUS | Status: AC | PRN
  Administered 2017-06-25: 100 mL via INTRAVENOUS
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
## 2017-06-25 NOTE — ED Notes (Signed)
Pt given a gingerale and sandwich.

## 2017-06-25 NOTE — ED Triage Notes (Addendum)
As pt was getting ready to leave he stated "oh, I forgot to tell you, I passed out earlier today."  Pt reports he did hit his head and is on blood thinners.  Upon further questioning it is reported that the mouth pain is actually numbness that started last night.

## 2017-06-25 NOTE — ED Notes (Addendum)
Pt states he started to have chest pain today took 4 nitros and did not help chest pain. It is worst when he take a deep breath. Also he is having numbness of the mouth and left arm at this time. Pt also had swelling in the right calf muscle for two days now with warmth. PT also states he passed out today and hit his head on the stirring wheel states he LOC for about 4 minutes

## 2017-06-25 NOTE — ED Triage Notes (Addendum)
Pt reports left sided chest pain started this morning along w/ facial numbness.  Pt has hx of DVT and PE, reports diaphoresis and nausea earlier this morning but none now.  Denies dizziness and weakness.  Reports taking three nitro but did not help w/ the chest pain.

## 2017-06-25 NOTE — ED Notes (Signed)
Pt requesting pain medication, Melvenia BeamShari, GeorgiaPA aware

## 2017-06-25 NOTE — ED Notes (Signed)
ED Provider at bedside. 

## 2017-06-26 MED ORDER — RIVAROXABAN 20 MG PO TABS
20.0000 mg | ORAL_TABLET | Freq: Once | ORAL | Status: AC
  Administered 2017-06-26: 20 mg via ORAL
  Filled 2017-06-26: qty 1

## 2017-06-26 NOTE — ED Notes (Signed)
Patient transported to CT 

## 2017-06-26 NOTE — ED Notes (Signed)
PT states understanding of care given, follow up care. PT ambulated from ED to car with a steady gait.  

## 2017-06-26 NOTE — Discharge Instructions (Signed)
Your lab and x-ray studies are all negative today. You can take Tylenol for your chest pain and follow up with your doctor for recheck and any further evaluation. It is important for you to fill your prescription for Xarelto and continue taking this medication.

## 2017-07-12 ENCOUNTER — Emergency Department (HOSPITAL_COMMUNITY): Admission: EM | Admit: 2017-07-12 | Discharge: 2017-07-12 | Discharge status: Absent without leave | Payer: Self-pay

## 2017-07-12 NOTE — ED Notes (Signed)
Pt not answering call in waiting room

## 2017-07-12 NOTE — ED Notes (Signed)
PT not In room to triage

## 2017-08-17 DIAGNOSIS — I6789 Other cerebrovascular disease: Secondary | ICD-10-CM

## 2017-08-17 DIAGNOSIS — I639 Cerebral infarction, unspecified: Secondary | ICD-10-CM

## 2017-08-17 DIAGNOSIS — F39 Unspecified mood [affective] disorder: Secondary | ICD-10-CM

## 2017-08-17 DIAGNOSIS — M79604 Pain in right leg: Secondary | ICD-10-CM

## 2017-10-23 ENCOUNTER — Encounter (HOSPITAL_COMMUNITY): Payer: Self-pay | Admitting: *Deleted

## 2017-10-23 ENCOUNTER — Other Ambulatory Visit: Payer: Self-pay

## 2017-10-23 ENCOUNTER — Emergency Department (HOSPITAL_COMMUNITY)
Admission: EM | Admit: 2017-10-23 | Discharge: 2017-10-23 | Disposition: A | Discharge status: Home / Self Care | Payer: Self-pay | Attending: Emergency Medicine | Admitting: Emergency Medicine

## 2017-10-23 DIAGNOSIS — Z5321 Procedure and treatment not carried out due to patient leaving prior to being seen by health care provider: Secondary | ICD-10-CM | POA: Insufficient documentation

## 2017-10-23 DIAGNOSIS — R109 Unspecified abdominal pain: Secondary | ICD-10-CM | POA: Insufficient documentation

## 2017-10-23 LAB — LIPASE, BLOOD: LIPASE: 30 U/L (ref 11–51)

## 2017-10-23 LAB — COMPREHENSIVE METABOLIC PANEL
ALT: 29 U/L (ref 17–63)
ANION GAP: 10 (ref 5–15)
AST: 24 U/L (ref 15–41)
Albumin: 4.1 g/dL (ref 3.5–5.0)
Alkaline Phosphatase: 57 U/L (ref 38–126)
BUN: 15 mg/dL (ref 6–20)
CHLORIDE: 109 mmol/L (ref 101–111)
CO2: 22 mmol/L (ref 22–32)
Calcium: 8.8 mg/dL — ABNORMAL LOW (ref 8.9–10.3)
Creatinine, Ser: 1.03 mg/dL (ref 0.61–1.24)
GFR calc non Af Amer: >60 mL/min (ref >60)
Glucose, Bld: 104 mg/dL — ABNORMAL HIGH (ref 65–99)
Potassium: 3.9 mmol/L (ref 3.5–5.1)
SODIUM: 141 mmol/L (ref 135–145)
Total Bilirubin: 0.3 mg/dL (ref 0.3–1.2)
Total Protein: 7.4 g/dL (ref 6.5–8.1)

## 2017-10-23 LAB — CBC
HCT: 42.3 % (ref 39.0–52.0)
HEMOGLOBIN: 14.2 g/dL (ref 13.0–17.0)
MCH: 32.9 pg (ref 26.0–34.0)
MCHC: 33.6 g/dL (ref 30.0–36.0)
MCV: 98.1 fL (ref 78.0–100.0)
Platelets: 285 10*3/uL (ref 150–400)
RBC: 4.31 MIL/uL (ref 4.22–5.81)
RDW: 15.2 % (ref 11.5–15.5)
WBC: 4.8 10*3/uL (ref 4.0–10.5)

## 2017-10-23 LAB — URINALYSIS, ROUTINE W REFLEX MICROSCOPIC
BILIRUBIN URINE: NEGATIVE
GLUCOSE, UA: NEGATIVE mg/dL
HGB URINE DIPSTICK: NEGATIVE
Ketones, ur: NEGATIVE mg/dL
LEUKOCYTES UA: NEGATIVE
Nitrite: NEGATIVE
PROTEIN: NEGATIVE mg/dL
SPECIFIC GRAVITY, URINE: 1.015 (ref 1.005–1.030)
pH: 5 (ref 5.0–8.0)

## 2017-10-23 NOTE — ED Triage Notes (Signed)
Pt states R sided abdominal pain x 1 week that was markedly worse yesterday.  Was seen at [facility] and they could find nothing.

## 2017-12-01 ENCOUNTER — Other Ambulatory Visit: Payer: Self-pay

## 2017-12-01 ENCOUNTER — Emergency Department (HOSPITAL_COMMUNITY)
Admission: EM | Admit: 2017-12-01 | Discharge: 2017-12-01 | Disposition: A | Discharge status: Home / Self Care | Payer: Self-pay | Attending: Emergency Medicine | Admitting: Emergency Medicine

## 2017-12-01 ENCOUNTER — Emergency Department (HOSPITAL_COMMUNITY): Payer: Self-pay

## 2017-12-01 DIAGNOSIS — F17200 Nicotine dependence, unspecified, uncomplicated: Secondary | ICD-10-CM | POA: Insufficient documentation

## 2017-12-01 DIAGNOSIS — Z79899 Other long term (current) drug therapy: Secondary | ICD-10-CM | POA: Insufficient documentation

## 2017-12-01 DIAGNOSIS — I10 Essential (primary) hypertension: Secondary | ICD-10-CM | POA: Insufficient documentation

## 2017-12-01 DIAGNOSIS — M7989 Other specified soft tissue disorders: Secondary | ICD-10-CM

## 2017-12-01 DIAGNOSIS — R079 Chest pain, unspecified: Secondary | ICD-10-CM | POA: Insufficient documentation

## 2017-12-01 DIAGNOSIS — R2241 Localized swelling, mass and lump, right lower limb: Secondary | ICD-10-CM | POA: Insufficient documentation

## 2017-12-01 DIAGNOSIS — Z91010 Allergy to peanuts: Secondary | ICD-10-CM | POA: Insufficient documentation

## 2017-12-01 LAB — BASIC METABOLIC PANEL
Anion gap: 10 (ref 5–15)
BUN: 11 mg/dL (ref 6–20)
CHLORIDE: 104 mmol/L (ref 101–111)
CO2: 25 mmol/L (ref 22–32)
CREATININE: 0.98 mg/dL (ref 0.61–1.24)
Calcium: 8.8 mg/dL — ABNORMAL LOW (ref 8.9–10.3)
Glucose, Bld: 111 mg/dL — ABNORMAL HIGH (ref 65–99)
Potassium: 3.7 mmol/L (ref 3.5–5.1)
SODIUM: 139 mmol/L (ref 135–145)

## 2017-12-01 LAB — CBC
HCT: 40.9 % (ref 39.0–52.0)
Hemoglobin: 13.7 g/dL (ref 13.0–17.0)
MCH: 32.2 pg (ref 26.0–34.0)
MCHC: 33.5 g/dL (ref 30.0–36.0)
MCV: 96 fL (ref 78.0–100.0)
PLATELETS: 244 10*3/uL (ref 150–400)
RBC: 4.26 MIL/uL (ref 4.22–5.81)
RDW: 15 % (ref 11.5–15.5)
WBC: 6.1 10*3/uL (ref 4.0–10.5)

## 2017-12-01 LAB — I-STAT TROPONIN, ED: Troponin i, poc: 0 ng/mL (ref 0.00–0.08)

## 2017-12-01 MED ORDER — KETOROLAC TROMETHAMINE 60 MG/2ML IM SOLN
60.0000 mg | Freq: Once | INTRAMUSCULAR | Status: AC
  Administered 2017-12-01: 60 mg via INTRAMUSCULAR
  Filled 2017-12-01: qty 2

## 2017-12-01 MED ORDER — CYCLOBENZAPRINE HCL 10 MG PO TABS: 10.0000 mg | ORAL_TABLET | Freq: Two times a day (BID) | ORAL | 0 refills | Status: DC | PRN

## 2017-12-01 NOTE — ED Notes (Signed)
Patient snoring when RN entered room. RN had to repeatedly awaken patient to answer questions. No acute distress noted.
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
## 2017-12-01 NOTE — Discharge Instructions (Signed)
Your testing today is unremarkable, please continue to take Xarelto daily, Flexeril as needed for muscle cramps, Tylenol as well.

## 2017-12-01 NOTE — ED Notes (Signed)
Pt verbalized understandintg of d/c instructions and has no further questions, VSS, NAD

## 2017-12-01 NOTE — ED Triage Notes (Signed)
Pt states last month was diagnosed with a DVT and placed on blood thinner. Yesterday he began to have chest pain with shortness of breath.

## 2018-04-20 DIAGNOSIS — F39 Unspecified mood [affective] disorder: Secondary | ICD-10-CM | POA: Diagnosis not present

## 2018-04-20 DIAGNOSIS — I8290 Acute embolism and thrombosis of unspecified vein: Secondary | ICD-10-CM | POA: Diagnosis not present

## 2018-04-20 DIAGNOSIS — R079 Chest pain, unspecified: Secondary | ICD-10-CM | POA: Diagnosis not present

## 2018-04-21 DIAGNOSIS — R079 Chest pain, unspecified: Secondary | ICD-10-CM | POA: Diagnosis not present

## 2018-04-21 DIAGNOSIS — I8290 Acute embolism and thrombosis of unspecified vein: Secondary | ICD-10-CM | POA: Diagnosis not present

## 2018-04-21 DIAGNOSIS — F39 Unspecified mood [affective] disorder: Secondary | ICD-10-CM | POA: Diagnosis not present

## 2018-05-30 ENCOUNTER — Other Ambulatory Visit: Payer: Self-pay

## 2018-05-30 ENCOUNTER — Encounter (HOSPITAL_COMMUNITY): Payer: Self-pay | Admitting: Emergency Medicine

## 2018-05-30 ENCOUNTER — Emergency Department (HOSPITAL_COMMUNITY)
Admission: EM | Admit: 2018-05-30 | Discharge: 2018-05-31 | Disposition: A | Discharge status: Home / Self Care | Payer: PRIVATE HEALTH INSURANCE | Attending: Emergency Medicine | Admitting: Emergency Medicine

## 2018-05-30 DIAGNOSIS — I10 Essential (primary) hypertension: Secondary | ICD-10-CM | POA: Insufficient documentation

## 2018-05-30 DIAGNOSIS — Z7901 Long term (current) use of anticoagulants: Secondary | ICD-10-CM | POA: Insufficient documentation

## 2018-05-30 DIAGNOSIS — Z79899 Other long term (current) drug therapy: Secondary | ICD-10-CM | POA: Insufficient documentation

## 2018-05-30 DIAGNOSIS — F17200 Nicotine dependence, unspecified, uncomplicated: Secondary | ICD-10-CM | POA: Insufficient documentation

## 2018-05-30 DIAGNOSIS — M25421 Effusion, right elbow: Secondary | ICD-10-CM | POA: Insufficient documentation

## 2018-05-30 DIAGNOSIS — Z91010 Allergy to peanuts: Secondary | ICD-10-CM | POA: Insufficient documentation

## 2018-05-30 LAB — COMPREHENSIVE METABOLIC PANEL
ALBUMIN: 4.1 g/dL (ref 3.5–5.0)
ALK PHOS: 55 U/L (ref 38–126)
ALT: 34 U/L (ref 0–44)
AST: 28 U/L (ref 15–41)
Anion gap: 12 (ref 5–15)
BILIRUBIN TOTAL: 1 mg/dL (ref 0.3–1.2)
BUN: 12 mg/dL (ref 6–20)
CALCIUM: 8.8 mg/dL — AB (ref 8.9–10.3)
CHLORIDE: 106 mmol/L (ref 98–111)
CO2: 22 mmol/L (ref 22–32)
Creatinine, Ser: 1.25 mg/dL — ABNORMAL HIGH (ref 0.61–1.24)
GFR calc Af Amer: >60 mL/min (ref >60)
GFR calc non Af Amer: >60 mL/min (ref >60)
Glucose, Bld: 105 mg/dL — ABNORMAL HIGH (ref 70–99)
Potassium: 3.5 mmol/L (ref 3.5–5.1)
SODIUM: 140 mmol/L (ref 135–145)
Total Protein: 7.5 g/dL (ref 6.5–8.1)

## 2018-05-30 LAB — CBC WITH DIFFERENTIAL/PLATELET
ABS IMMATURE GRANULOCYTES: 0 10*3/uL (ref 0.0–0.1)
BASOS PCT: 1 %
Basophils Absolute: 0 10*3/uL (ref 0.0–0.1)
Eosinophils Absolute: 0.2 10*3/uL (ref 0.0–0.7)
Eosinophils Relative: 4 %
HCT: 44.5 % (ref 39.0–52.0)
HEMOGLOBIN: 14.6 g/dL (ref 13.0–17.0)
IMMATURE GRANULOCYTES: 0 %
LYMPHS PCT: 52 %
Lymphs Abs: 3.4 10*3/uL (ref 0.7–4.0)
MCH: 31.9 pg (ref 26.0–34.0)
MCHC: 32.8 g/dL (ref 30.0–36.0)
MCV: 97.4 fL (ref 78.0–100.0)
MONOS PCT: 5 %
Monocytes Absolute: 0.3 10*3/uL (ref 0.1–1.0)
NEUTROS ABS: 2.6 10*3/uL (ref 1.7–7.7)
NEUTROS PCT: 40 %
PLATELETS: 278 10*3/uL (ref 150–400)
RBC: 4.57 MIL/uL (ref 4.22–5.81)
RDW: 14.1 % (ref 11.5–15.5)
WBC: 6.5 10*3/uL (ref 4.0–10.5)

## 2018-05-30 LAB — I-STAT CG4 LACTIC ACID, ED: Lactic Acid, Venous: 1.57 mmol/L (ref 0.5–1.9)

## 2018-05-30 NOTE — ED Triage Notes (Signed)
Pt had I&D of R elbow yesterday.  Reports increased pain and swelling to R elbow as well as swelling to R arm/hand.

## 2018-05-30 NOTE — ED Provider Notes (Signed)
Patient placed in Quick Look pathway, seen and evaluated   Chief Complaint: Right elbow and arm swelling  HPI:   47 year old male presents with worsening right elbow and new onset of right arm swelling since yesterday.  He states that he had an incision and drainage of his elbow.  This was due to an olecranon but bursitis from a work injury.  It drained out purulent fluid.  He was given two pills of Keflex and Percocet which he has been taking.  He reports a fever of 103 last night.  His entire arm has become swollen including his fingers and he reports decreased sensation to the area.  The elbow has become swollen again and seems to be more than yesterday.  ROS: +right elbow and arm swelling.  Physical Exam:   Gen: No distress  Neuro: Awake and Alert  Skin: Warm    Focused Exam: Right arm: Olecranon is swollen. There is swelling of the arm from the fingers to the elbow. 2+ radial pulse.    Initiation of care has begun. The patient has been counseled on the process, plan, and necessity for staying for the completion/evaluation, and the remainder of the medical screening examination    Bethel BornGekas, Estefania Kamiya Marie, PA-C 05/30/18 1944    Shaune PollackIsaacs, Cameron, MD 06/01/18 1101

## 2018-05-31 MED ORDER — BUPIVACAINE HCL (PF) 0.5 % IJ SOLN
10.0000 mL | Freq: Once | INTRAMUSCULAR | Status: DC
  Filled 2018-05-31: qty 10

## 2018-05-31 MED ORDER — SODIUM CHLORIDE 0.9 % IV BOLUS: 1000.0000 mL | Freq: Once | INTRAVENOUS | Status: DC

## 2018-05-31 MED ORDER — OXYCODONE HCL 5 MG PO TABS
5.0000 mg | ORAL_TABLET | Freq: Once | ORAL | Status: AC
  Administered 2018-05-31: 5 mg via ORAL
  Filled 2018-05-31: qty 1

## 2018-05-31 MED ORDER — SULFAMETHOXAZOLE-TRIMETHOPRIM 800-160 MG PO TABS: 1.0000 | ORAL_TABLET | Freq: Two times a day (BID) | ORAL | 0 refills | Status: AC

## 2018-05-31 NOTE — ED Notes (Addendum)
Pt notified he will not be received pain medication once he leaves per Blinda LeatherwoodPollina, MD. Pt polite and understanding. D/c insturctionts, wound care, and antibiotic education reviewed with pt. During instructions wife stated "I don't give a fuck." Wife being verbally aggressive with staff and notified pt has been medically cleared. Unable to get D/C vital signs.

## 2018-05-31 NOTE — Discharge Instructions (Addendum)
Bactrim  Wound Care - After I&D of Abscess  You may remove the bandage after 24 hours.  The only reason to replace the bandage is to protect clothing from drainage. Bandages, if used, should be replaced daily or whenever soiled. The wound may continue to drain for the next 2-3 days.   Clean the wound and surrounding area gently with tap water and mild soap. Rinse well and blot dry. You may shower normally. Soaking the wound in Epsom salt baths for no more than 15 minutes once a day may help rinse out any remaining pus and help with wound healing.  Clean the wound daily to prevent further infection. Do not use cleaners such as hydrogen peroxide or alcohol.   Scar reduction: Application of a topical antibiotic ointment, such as Neosporin, after the wound has begun to close and heal well can decrease scab formation and reduce scarring. After the wound has healed, application of ointments such as Aquaphor can also reduce scar formation.  The key to scar reduction is keeping the skin well hydrated and supple. Drinking plenty of water throughout the day (At least eight 8oz glasses of water a day) is essential to staying well hydrated.  Sun exposure: Keep the wound out of the sun. After the wound has healed, continue to protect it from the sun by wearing protective clothing or applying sunscreen.  Pain: You may use the percocet for pain.  We will add an additional antibiotic to the Keflex. Continue taking the Keflex, but add in the Bactrim. Please take all of your antibiotics until finished!   You may develop abdominal discomfort or diarrhea from the antibiotic.  You may help offset this with probiotics which you can buy or get in yogurt. Do not eat or take the probiotics until 2 hours after your antibiotic.   Follow up: Follow up with the orthopedic specialist in 2 days for a wound check to assure proper healing.  Return to the ED sooner should signs of worsening infection arise, such as spreading  redness, worsening puffiness/swelling, severe increase in pain, fever over 100.88F, or any other major issues.
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
## 2018-06-02 LAB — AEROBIC CULTURE W GRAM STAIN (SUPERFICIAL SPECIMEN): Culture: NO GROWTH

## 2018-06-02 LAB — AEROBIC CULTURE  (SUPERFICIAL SPECIMEN)

## 2018-06-04 LAB — CULTURE, BLOOD (ROUTINE X 2)
CULTURE: NO GROWTH
CULTURE: NO GROWTH

## 2018-08-25 ENCOUNTER — Emergency Department (HOSPITAL_COMMUNITY)
Admission: EM | Admit: 2018-08-25 | Discharge: 2018-08-25 | Disposition: A | Discharge status: Home / Self Care | Payer: BLUE CROSS/BLUE SHIELD | Attending: Emergency Medicine | Admitting: Emergency Medicine

## 2018-08-25 ENCOUNTER — Encounter (HOSPITAL_COMMUNITY): Payer: Self-pay | Admitting: Emergency Medicine

## 2018-08-25 ENCOUNTER — Emergency Department (HOSPITAL_COMMUNITY): Payer: BLUE CROSS/BLUE SHIELD

## 2018-08-25 DIAGNOSIS — Z79899 Other long term (current) drug therapy: Secondary | ICD-10-CM | POA: Insufficient documentation

## 2018-08-25 DIAGNOSIS — F17210 Nicotine dependence, cigarettes, uncomplicated: Secondary | ICD-10-CM | POA: Insufficient documentation

## 2018-08-25 DIAGNOSIS — Z91010 Allergy to peanuts: Secondary | ICD-10-CM | POA: Insufficient documentation

## 2018-08-25 DIAGNOSIS — R079 Chest pain, unspecified: Secondary | ICD-10-CM

## 2018-08-25 LAB — CBC
HEMATOCRIT: 40 % (ref 39.0–52.0)
HEMOGLOBIN: 13.5 g/dL (ref 13.0–17.0)
MCH: 32.8 pg (ref 26.0–34.0)
MCHC: 33.8 g/dL (ref 30.0–36.0)
MCV: 97.1 fL (ref 78.0–100.0)
Platelets: 305 10*3/uL (ref 150–400)
RBC: 4.12 MIL/uL — ABNORMAL LOW (ref 4.22–5.81)
RDW: 14.6 % (ref 11.5–15.5)
WBC: 7.4 10*3/uL (ref 4.0–10.5)

## 2018-08-25 LAB — BASIC METABOLIC PANEL
ANION GAP: 12 (ref 5–15)
BUN: 20 mg/dL (ref 6–20)
CO2: 22 mmol/L (ref 22–32)
Calcium: 8.7 mg/dL — ABNORMAL LOW (ref 8.9–10.3)
Chloride: 107 mmol/L (ref 98–111)
Creatinine, Ser: 1.39 mg/dL — ABNORMAL HIGH (ref 0.61–1.24)
GFR calc Af Amer: >60 mL/min (ref >60)
GFR calc non Af Amer: 59 mL/min — ABNORMAL LOW (ref >60)
GLUCOSE: 92 mg/dL (ref 70–99)
Potassium: 4.4 mmol/L (ref 3.5–5.1)
Sodium: 141 mmol/L (ref 135–145)

## 2018-08-25 LAB — I-STAT TROPONIN, ED
Troponin i, poc: 0 ng/mL (ref 0.00–0.08)
Troponin i, poc: 0 ng/mL (ref 0.00–0.08)

## 2018-08-25 MED ORDER — SODIUM CHLORIDE 0.9 % IV BOLUS
1000.0000 mL | Freq: Once | INTRAVENOUS | Status: AC
  Administered 2018-08-25: 1000 mL via INTRAVENOUS

## 2018-08-25 NOTE — ED Triage Notes (Signed)
Brought by ems from side of the road with c/o central chest wall pain worse with deep breaths and palpation.  Patient reports drinking liquor tonight unable to endorse how much.  Noted to be slurring in triage.
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
## 2018-08-25 NOTE — Discharge Instructions (Signed)
Follow-up with your primary care doctor. °Return to the ED for new or worsening symptoms. °

## 2018-08-25 NOTE — ED Notes (Signed)
Taken to xray at this time. 

## 2018-11-18 ENCOUNTER — Encounter (HOSPITAL_COMMUNITY): Payer: Self-pay | Admitting: *Deleted

## 2018-11-18 ENCOUNTER — Inpatient Hospital Stay (HOSPITAL_COMMUNITY)
Admission: AD | Admit: 2018-11-18 | Discharge: 2018-11-28 | DRG: 885 | Disposition: A | Discharge status: Home / Self Care | Payer: Federal, State, Local not specified - Other | Source: Intra-hospital | Attending: Psychiatry | Admitting: Psychiatry

## 2018-11-18 ENCOUNTER — Other Ambulatory Visit: Payer: Self-pay

## 2018-11-18 DIAGNOSIS — Z8673 Personal history of transient ischemic attack (TIA), and cerebral infarction without residual deficits: Secondary | ICD-10-CM

## 2018-11-18 DIAGNOSIS — Z915 Personal history of self-harm: Secondary | ICD-10-CM

## 2018-11-18 DIAGNOSIS — F17210 Nicotine dependence, cigarettes, uncomplicated: Secondary | ICD-10-CM | POA: Diagnosis present

## 2018-11-18 DIAGNOSIS — Z8249 Family history of ischemic heart disease and other diseases of the circulatory system: Secondary | ICD-10-CM | POA: Diagnosis not present

## 2018-11-18 DIAGNOSIS — Z823 Family history of stroke: Secondary | ICD-10-CM

## 2018-11-18 DIAGNOSIS — F332 Major depressive disorder, recurrent severe without psychotic features: Secondary | ICD-10-CM | POA: Diagnosis not present

## 2018-11-18 DIAGNOSIS — G4700 Insomnia, unspecified: Secondary | ICD-10-CM | POA: Diagnosis present

## 2018-11-18 DIAGNOSIS — Z886 Allergy status to analgesic agent status: Secondary | ICD-10-CM

## 2018-11-18 DIAGNOSIS — Z599 Problem related to housing and economic circumstances, unspecified: Secondary | ICD-10-CM | POA: Diagnosis not present

## 2018-11-18 DIAGNOSIS — Z7901 Long term (current) use of anticoagulants: Secondary | ICD-10-CM | POA: Diagnosis not present

## 2018-11-18 DIAGNOSIS — I10 Essential (primary) hypertension: Secondary | ICD-10-CM | POA: Diagnosis present

## 2018-11-18 DIAGNOSIS — F419 Anxiety disorder, unspecified: Secondary | ICD-10-CM | POA: Diagnosis not present

## 2018-11-18 DIAGNOSIS — D689 Coagulation defect, unspecified: Secondary | ICD-10-CM | POA: Diagnosis present

## 2018-11-18 DIAGNOSIS — Z79899 Other long term (current) drug therapy: Secondary | ICD-10-CM | POA: Diagnosis not present

## 2018-11-18 DIAGNOSIS — R45851 Suicidal ideations: Secondary | ICD-10-CM | POA: Diagnosis present

## 2018-11-18 DIAGNOSIS — Z86718 Personal history of other venous thrombosis and embolism: Secondary | ICD-10-CM

## 2018-11-18 DIAGNOSIS — Z881 Allergy status to other antibiotic agents status: Secondary | ICD-10-CM | POA: Diagnosis not present

## 2018-11-18 DIAGNOSIS — Z91018 Allergy to other foods: Secondary | ICD-10-CM | POA: Diagnosis not present

## 2018-11-18 DIAGNOSIS — Z885 Allergy status to narcotic agent status: Secondary | ICD-10-CM | POA: Diagnosis not present

## 2018-11-18 DIAGNOSIS — F323 Major depressive disorder, single episode, severe with psychotic features: Principal | ICD-10-CM | POA: Diagnosis present

## 2018-11-18 DIAGNOSIS — Z880 Allergy status to penicillin: Secondary | ICD-10-CM

## 2018-11-18 DIAGNOSIS — Z86711 Personal history of pulmonary embolism: Secondary | ICD-10-CM

## 2018-11-18 DIAGNOSIS — F410 Panic disorder [episodic paroxysmal anxiety] without agoraphobia: Secondary | ICD-10-CM | POA: Diagnosis present

## 2018-11-18 DIAGNOSIS — Z888 Allergy status to other drugs, medicaments and biological substances status: Secondary | ICD-10-CM

## 2018-11-18 DIAGNOSIS — F322 Major depressive disorder, single episode, severe without psychotic features: Secondary | ICD-10-CM | POA: Diagnosis not present

## 2018-11-18 MED ORDER — RIVAROXABAN 15 MG PO TABS
15.0000 mg | ORAL_TABLET | Freq: Two times a day (BID) | ORAL | Status: DC
  Administered 2018-11-18 – 2018-11-28 (×20): 15 mg via ORAL
  Filled 2018-11-18 (×26): qty 1

## 2018-11-18 MED ORDER — HYDROXYZINE HCL 25 MG PO TABS
25.0000 mg | ORAL_TABLET | Freq: Three times a day (TID) | ORAL | Status: DC | PRN
  Administered 2018-11-18 – 2018-11-28 (×14): 25 mg via ORAL
  Filled 2018-11-18 (×9): qty 1
  Filled 2018-11-18: qty 10
  Filled 2018-11-18 (×6): qty 1

## 2018-11-18 MED ORDER — TRAZODONE HCL 50 MG PO TABS
50.0000 mg | ORAL_TABLET | Freq: Every evening | ORAL | Status: DC | PRN
  Administered 2018-11-18 – 2018-11-24 (×7): 50 mg via ORAL
  Filled 2018-11-18 (×7): qty 1

## 2018-11-18 MED ORDER — MAGNESIUM HYDROXIDE 400 MG/5ML PO SUSP: 30.0000 mL | Freq: Every day | ORAL | Status: DC | PRN

## 2018-11-18 MED ORDER — ENSURE ENLIVE PO LIQD
237.0000 mL | Freq: Two times a day (BID) | ORAL | Status: DC
  Administered 2018-11-19 – 2018-11-27 (×10): 237 mL via ORAL

## 2018-11-18 MED ORDER — ALUM & MAG HYDROXIDE-SIMETH 200-200-20 MG/5ML PO SUSP
30.0000 mL | ORAL | Status: DC | PRN
  Administered 2018-11-23 – 2018-11-27 (×4): 30 mL via ORAL
  Filled 2018-11-18 (×4): qty 30

## 2018-11-18 NOTE — Tx Team (Signed)
Initial Treatment Plan 11/18/2018 6:18 PM Kharon Cordella RegisterM Cathers UJW:[id number]    PATIENT STRESSORS: Health problems Marital or family conflict Medication change or noncompliance Occupational concerns   PATIENT STRENGTHS: Ability for insight Capable of independent living Communication skills Supportive family/friends   PATIENT IDENTIFIED PROBLEMS: Alterations in mood (Increased depression and Anxiety) "I get angry easily, my depression has gotten worse especially with my DVTs)  Alteration in sleep pattern "I've been sleeping 2 hrs/night lately with everything happening with my health".  Alteration in appetite (recent weight loss approximately 15ls d/t poor appetite)                 DISCHARGE CRITERIA:  Improved stabilization in mood, thinking, and/or behavior Verbal commitment to aftercare and medication compliance  PRELIMINARY DISCHARGE PLAN: Outpatient therapy Return to previous living arrangement  PATIENT/FAMILY INVOLVEMENT: This treatment plan has been presented to and reviewed with the patient, Jake Sharkapoleon M Head. The patient have been given the opportunity to ask questions and make suggestions.  Sherryl MangesWesseh, Phares Zaccone, RN 11/18/2018, 6:18 PM
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
## 2018-11-18 NOTE — [facility] Group Notes (Signed)
[facility] Group Notes:  (Nursing/MHT/Case Management/Adjunct)  Date:  11/18/2018  Time:  6:02 PM  Type of Therapy:  Nurse Education  Participation Level:  Active  Participation Quality:  Appropriate and Attentive  Affect:  Appropriate  Cognitive:  Alert and Appropriate  Insight:  Good  Engagement in Group:  Engaged  Modes of Intervention:  Activity and Discussion  Summary of Progress/Problems: In this group, patients were educated on how to express their needs to others in a healthy way. They explored unhealthy communication and new methods to manage stressful relationship situations. Patients discussed stress management skills and how to incorporate them into their daily life. The patient was active and participated readily.   Kirstie MirzaJonathan C Sunnie Odden 11/18/2018, 6:02 PM

## 2018-11-18 NOTE — Progress Notes (Signed)
Adult Psychoeducational Group Note  Date:  11/18/2018 Time:  9:45 PM  Group Topic/Focus:  Wrap-Up Group:   The focus of this group is to help patients review their daily goal of treatment and discuss progress on daily workbooks.  Participation Level:  Active  Participation Quality:  Appropriate and Attentive  Affect:  Appropriate  Cognitive:  Alert and Appropriate  Insight: Appropriate and Good  Engagement in Group:  Engaged  Modes of Intervention:  Discussion  Additional Comments:  Pt said his day was a 2. The one positive thing that happened he had a meeting that was very important to him.  Charna Busmanobinson, Kyng Matlock Long 11/18/2018, 9:45 PM

## 2018-11-19 DIAGNOSIS — F419 Anxiety disorder, unspecified: Secondary | ICD-10-CM

## 2018-11-19 DIAGNOSIS — R45851 Suicidal ideations: Secondary | ICD-10-CM

## 2018-11-19 DIAGNOSIS — F322 Major depressive disorder, single episode, severe without psychotic features: Secondary | ICD-10-CM

## 2018-11-19 LAB — URINALYSIS, COMPLETE (UACMP) WITH MICROSCOPIC
BILIRUBIN URINE: NEGATIVE
GLUCOSE, UA: NEGATIVE mg/dL
HGB URINE DIPSTICK: NEGATIVE
KETONES UR: NEGATIVE mg/dL
LEUKOCYTES UA: NEGATIVE
NITRITE: NEGATIVE
PH: 6 (ref 5.0–8.0)
Protein, ur: NEGATIVE mg/dL
Specific Gravity, Urine: 1.021 (ref 1.005–1.030)

## 2018-11-19 LAB — TSH: TSH: 2.712 u[IU]/mL (ref 0.350–4.500)

## 2018-11-19 MED ORDER — QUETIAPINE FUMARATE 25 MG PO TABS
25.0000 mg | ORAL_TABLET | ORAL | Status: DC
  Administered 2018-11-20 – 2018-11-21 (×2): 25 mg via ORAL
  Filled 2018-11-19 (×3): qty 1

## 2018-11-19 MED ORDER — DULOXETINE HCL 30 MG PO CPEP
30.0000 mg | ORAL_CAPSULE | Freq: Every day | ORAL | Status: DC
  Administered 2018-11-19 – 2018-11-20 (×2): 30 mg via ORAL
  Filled 2018-11-19 (×4): qty 1

## 2018-11-19 MED ORDER — QUETIAPINE FUMARATE 50 MG PO TABS
50.0000 mg | ORAL_TABLET | Freq: Every day | ORAL | Status: DC
  Administered 2018-11-19: 50 mg via ORAL
  Filled 2018-11-19 (×3): qty 1
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
## 2018-11-19 NOTE — [facility] Group Notes (Signed)
Did not attend wrap up group this evening. Pt stayed in the hallway instead.

## 2018-11-19 NOTE — [facility] Group Notes (Signed)
LCSW Group Therapy Note 11/19/2018 11:54 AM  Type of Therapy and Topic: Group Therapy: Overcoming Obstacles  Participation Level: Did Not Attend  Description of Group:  In this group patients will be encouraged to explore what they see as obstacles to their own wellness and recovery. They will be guided to discuss their thoughts, feelings, and behaviors related to these obstacles. The group will process together ways to cope with barriers, with attention given to specific choices patients can make. Each patient will be challenged to identify changes they are motivated to make in order to overcome their obstacles. This group will be process-oriented, with patients participating in exploration of their own experiences as well as giving and receiving support and challenge from other group members.  Therapeutic Goals: 1. Patient will identify personal and current obstacles as they relate to admission. 2. Patient will identify barriers that currently interfere with their wellness or overcoming obstacles.  3. Patient will identify feelings, thought process and behaviors related to these barriers. 4. Patient will identify two changes they are willing to make to overcome these obstacles:   Summary of Patient Progress  Invited, chose not to attend.    Therapeutic Modalities:  Cognitive Behavioral Therapy Solution Focused Therapy Motivational Interviewing Relapse Prevention Therapy   Kishana Battey LCSWA Clinical Social Worker   

## 2018-11-19 NOTE — Tx Team (Signed)
Interdisciplinary Treatment and Diagnostic Plan Update  11/19/2018 Time of Session:  [name] [name] MRN: [id number]  Principal Diagnosis: <principal problem not specified>  Secondary Diagnoses: Active Problems:   MDD (major depressive disorder), severe ([name]   Current Medications:  Current Facility-Administered Medications  Medication Dose Route Frequency Provider Last Rate Last Dose  . alum & mag hydroxide-simeth (MAALOX/MYLANTA) 200-200-20 MG/5ML suspension 30 mL  30 mL Oral Q4H PRN Money, Lowry Ram, FNP      . feeding supplement (ENSURE ENLIVE) (ENSURE ENLIVE) liquid 237 mL  237 mL Oral BID BM Cobos, Fernando A, MD      . hydrOXYzine (ATARAX/VISTARIL) tablet 25 mg  25 mg Oral TID PRN Money, Lowry Ram, FNP   25 mg at 11/19/18 0814  . magnesium hydroxide (MILK OF MAGNESIA) suspension 30 mL  30 mL Oral Daily PRN Money, Lowry Ram, FNP      . Rivaroxaban (XARELTO) tablet 15 mg  15 mg Oral BID Money, Lowry Ram, FNP   15 mg at 11/19/18 4098  . traZODone (DESYREL) tablet 50 mg  50 mg Oral QHS PRN Money, Lowry Ram, FNP   50 mg at 11/18/18 2110   PTA Medications: Medications Prior to Admission  Medication Sig Dispense Refill Last Dose  . nitroGLYCERIN (NITROSTAT) 0.4 MG SL tablet Place 0.4 mg under the tongue every 5 (five) minutes as needed for chest pain.   unknown  . Rivaroxaban (XARELTO) 20 MG TABS tablet Take 1 tablet (20 mg total) by mouth daily with supper. (Patient not taking: Reported on 05/31/2018) 30 tablet 0 Not Taking at Unknown time    Patient Stressors: Health problems Marital or family conflict Medication change or noncompliance Occupational concerns  Patient Strengths: Ability for insight Capable of independent living Communication skills Supportive family/friends  Treatment Modalities: Medication Management, Group therapy, Case management,  1 to 1 session with clinician, Psychoeducation, Recreational therapy.   Physician Treatment Plan for Primary Diagnosis:  <principal problem not specified> Long Term Goal(s):     Short Term Goals:    Medication Management: Evaluate patient's response, side effects, and tolerance of medication regimen.  Therapeutic Interventions: 1 to 1 sessions, Unit Group sessions and Medication administration.  Evaluation of Outcomes: Not Met  Physician Treatment Plan for Secondary Diagnosis: Active Problems:   MDD (major depressive disorder), severe ([place])  Long Term Goal(s):     Short Term Goals:       Medication Management: Evaluate patient's response, side effects, and tolerance of medication regimen.  Therapeutic Interventions: 1 to 1 sessions, Unit Group sessions and Medication administration.  Evaluation of Outcomes: Not Met   RN Treatment Plan for Primary Diagnosis: <principal problem not specified> Long Term Goal(s): Knowledge of disease and therapeutic regimen to maintain health will improve  Short Term Goals: Ability to participate in decision making will improve, Ability to verbalize feelings will improve, Ability to disclose and discuss suicidal ideas and Ability to identify and develop effective coping behaviors will improve  Medication Management: RN will administer medications as ordered by provider, will assess and evaluate patient's response and provide education to patient for prescribed medication. RN will report any adverse and/or side effects to prescribing provider.  Therapeutic Interventions: 1 on 1 counseling sessions, Psychoeducation, Medication administration, Evaluate responses to treatment, Monitor vital signs and CBGs as ordered, Perform/monitor CIWA, COWS, AIMS and Fall Risk screenings as ordered, Perform wound care treatments as ordered.  Evaluation of Outcomes: Not Met   LCSW Treatment Plan for Primary Diagnosis: <principal problem not  specified> Long Term Goal(s): Safe transition to appropriate next level of care at discharge, Engage patient in therapeutic group addressing  interpersonal concerns.  Short Term Goals: Engage patient in aftercare planning with referrals and resources  Therapeutic Interventions: Assess for all discharge needs, 1 to 1 time with Social worker, Explore available resources and support systems, Assess for adequacy in community support network, Educate family and significant other(s) on suicide prevention, Complete Psychosocial Assessment, Interpersonal group therapy.  Evaluation of Outcomes: Not Met   Progress in Treatment: Attending groups: No. Participating in groups: No. Taking medication as prescribed: Yes. Toleration medication: Yes. Family/Significant other contact made: No, will contact:  if patient consents to collateral contacts Patient understands diagnosis: Yes. Discussing patient identified problems/goals with staff: Yes. Medical problems stabilized or resolved: Yes. Denies suicidal/homicidal ideation: Yes. Issues/concerns per patient self-inventory: No. Other:   New problem(s) identified: None   New Short Term/Long Term Goal(s):  medication stabilization, elimination of SI thoughts, development of comprehensive mental wellness plan.    Patient Goals: I get angry easily, my depression has gotten worse especially with my DVTs   Discharge Plan or Barriers: CSW will continue to assess and follow for appropriate referrals and possible discharge planning.   Reason for Continuation of Hospitalization: Depression Medication stabilization Suicidal ideation  Estimated Length of Stay: 3-5 days   Attendees: Patient: 11/19/2018 8:54 AM  Physician: Dr. Neita Garnet, MD 11/19/2018 8:54 AM  Nursing: Clarise Cruz. Carlean Jews, RN 11/19/2018 8:54 AM  RN Care Manager: 11/19/2018 8:54 AM  Social Worker: Radonna Ricker, [place] 11/19/2018 8:54 AM  Recreational Therapist:  11/19/2018 8:54 AM  Other:  11/19/2018 8:54 AM  Other:  11/19/2018 8:54 AM  Other: 11/19/2018 8:54 AM    Scribe for Treatment Team: Marylee Floras,  [place] 11/19/2018 8:54 AM

## 2018-11-19 NOTE — Progress Notes (Signed)
Patient ID: [name] [name], male   DOB: 1971-01-15, 47 y.o.   MRN: [id number]  Pt currently presents with a flat affect and anxious behavior. PT thinking is concrete. Pt reports to writer that their goal is to "get out of here, I don't think this is the place for me." Reports ongoing anxiety and depression. Seeing shadows this morning, pt baseline. Pt reports poor sleep with current medication regimen. Also reports ongoing pain in his right leg but refused any treatment.   Pt provided with medications per providers orders. Pt's labs and vitals were monitored throughout the night. Pt supported emotionally and encouraged to express concerns and questions. Pt educated on medications.  Pt's safety ensured with 15 minute and environmental checks. Pt currently denies SI/HI and A/V hallucinations. Pt verbally agrees to seek staff if SI/HI or A/VH occurs and to consult with staff before acting on any harmful thoughts. Will continue POC.
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
## 2018-11-19 NOTE — Progress Notes (Signed)
Recreation Therapy Notes  Date: 12.30.19 Time: 0930 Location: [address] Dayroom  Group Topic: Stress Management  Goal Area(s) Addresses:  Patient will identify stress management techniques. Patient will identify benefits of using stress management techniques post d/c.  Intervention: Stress Management  Activity :  Guided Imagery.  LRT introduced the stress management technique of guided imagery.  LRT read a scrip that guided patients on the beach to relax by the peacefully waves.  Education:  Stress Management, Discharge Planning.   Education Outcome: Acknowledges Education  Clinical Observations/Feedback:  Pt did not attend group.     Caroll RancherMarjette Geraline Halberstadt, LRT/CTRS         Caroll RancherLindsay, Kha Hari A 11/19/2018 10:46 AM

## 2018-11-19 NOTE — Progress Notes (Signed)
NUTRITION ASSESSMENT  Pt identified as at risk on the Malnutrition Screen Tool  INTERVENTION: 1. Supplements:Ensure Enlive po BID, each supplement provides 350 kcal and 20 grams of protein  NUTRITION DIAGNOSIS: Unintentional weight loss related to sub-optimal intake as evidenced by pt report.   Goal: Pt to meet >/= 90% of their estimated nutrition needs.  Monitor:  PO intake  Assessment:  Pt admitted with depression. Pt reports poor appetite and had not eaten in days PTA. Per weight records, pt has lost 26 lb since January 2019. This is an insignificant weight loss for time frame but his weight has been trending down for about a year now. Will order Ensure supplements.  Height: Ht Readings from Last 1 Encounters:  11/18/18 6' (1.829 m)    Weight: Wt Readings from Last 1 Encounters:  11/18/18 82.1 kg    Weight Hx: Wt Readings from Last 10 Encounters:  11/18/18 82.1 kg  08/25/18 87.1 kg  05/30/18 87.1 kg  12/01/17 93.9 kg  10/23/17 93.9 kg  04/07/17 79.4 kg  03/11/17 79.4 kg  02/27/17 79.4 kg  01/28/17 78.5 kg  12/24/16 78.5 kg    BMI:  Body mass index is 24.55 kg/m. Pt meets criteria for normal based on current BMI.  Estimated Nutritional Needs: Kcal: 25-30 kcal/kg Protein: > 1 gram protein/kg Fluid: 1 ml/kcal  Diet Order:  Diet Order            Diet regular Room service appropriate? Yes; Fluid consistency: Thin  Diet effective now             Pt is also offered choice of unit snacks mid-morning and mid-afternoon.  Pt is eating as desired.   Lab results and medications reviewed.   Tilda FrancoLindsey Mako Pelfrey, MS, RD, LDN [facility] Clinical Dietitian Pager: [phone] After Hours Pager: [phone]

## 2018-11-19 NOTE — Progress Notes (Addendum)
D: Patient observed up and visible in dayroom this evening. Patient states, "the suicidal thoughts and the shadows have stopped now that I'm here and have been talking to the others. It helps but the transition over here has made me anxious." Patient's affect anxious with congruent mood. Denies pain, physical complaints.   A: Medicated per orders, prn trazadone and vistaril given per his request. Medication education provided. Level III obs in place for safety. Emotional support offered. Patient encouraged to complete Suicide Safety Plan before discharge. Encouraged to attend and participate in unit programming. Fall prevention plan reviewed and in place. Patient is high fall risk due to anticoagulant therapy.  R: Patient verbalizes understanding of POC, falls education. On reassess, patient reports some decrease in symptoms. Patient denies SI/HI/AH and remains safe on level III obs. Will continue to monitor throughout the night.

## 2018-11-20 DIAGNOSIS — F332 Major depressive disorder, recurrent severe without psychotic features: Secondary | ICD-10-CM

## 2018-11-20 DIAGNOSIS — G4700 Insomnia, unspecified: Secondary | ICD-10-CM

## 2018-11-20 LAB — HEMOGLOBIN A1C
HEMOGLOBIN A1C: 5.3 % (ref 4.8–5.6)
Mean Plasma Glucose: 105.41 mg/dL

## 2018-11-20 LAB — LIPID PANEL
Cholesterol: 172 mg/dL (ref 0–200)
HDL: 49 mg/dL (ref >40)
LDL CALC: 89 mg/dL (ref 0–99)
Total CHOL/HDL Ratio: 3.5 RATIO
Triglycerides: 168 mg/dL — ABNORMAL HIGH (ref <150)
VLDL: 34 mg/dL (ref 0–40)

## 2018-11-20 LAB — TSH: TSH: 1.231 u[IU]/mL (ref 0.350–4.500)

## 2018-11-20 MED ORDER — QUETIAPINE FUMARATE 100 MG PO TABS
100.0000 mg | ORAL_TABLET | Freq: Every day | ORAL | Status: DC
  Administered 2018-11-20: 100 mg via ORAL
  Filled 2018-11-20 (×2): qty 1

## 2018-11-20 MED ORDER — DULOXETINE HCL 20 MG PO CPEP
40.0000 mg | ORAL_CAPSULE | Freq: Every day | ORAL | Status: DC
  Administered 2018-11-21 – 2018-11-23 (×3): 40 mg via ORAL
  Filled 2018-11-20 (×4): qty 2

## 2018-11-20 NOTE — B[facility] Group Notes (Signed)
LCSW Group Therapy Note 11/20/2018 2:16 PM  Type of Therapy/Topic: Group Therapy: Emotion Regulation  Participation Level: Did Not Attend   Description of Group:  The purpose of this group is to assist patients in learning to regulate negative emotions and experience positive emotions. Patients will be guided to discuss ways in which they have been vulnerable to their negative emotions. These vulnerabilities will be juxtaposed with experiences of positive emotions or situations, and patients will be challenged to use positive emotions to combat negative ones. Special emphasis will be placed on coping with negative emotions in conflict situations, and patients will process healthy conflict resolution skills.  Therapeutic Goals: 1. Patient will identify two positive emotions or experiences to reflect on in order to balance out negative emotions 2. Patient will label two or more emotions that they find the most difficult to experience 3. Patient will demonstrate positive conflict resolution skills through discussion and/or role plays  Summary of Patient Progress:  Invited, chose not to attend.    Therapeutic Modalities:  Cognitive Behavioral Therapy Feelings Identification Dialectical Behavioral Therapy   Alcario DroughtJolan Hera Celaya LCSWA Clinical Social Worker

## 2018-11-20 NOTE — [facility] Group Notes (Signed)
[facility] Group Notes:  (Nursing/MHT/Case Management/Adjunct)  Date:  11/20/2018  Time:  3:15 PM  Type of Therapy:  Psychoeducational Skills  Participation Level:  Did Not Attend       [name] [name] 11/20/2018, 3:15 PM
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
## 2018-11-20 NOTE — BHH Suicide Risk Assessment (Signed)
[facility] INPATIENT:  Family/Significant Other Suicide Prevention Education  Suicide Prevention Education:  Patient Refusal for Family/Significant Other Suicide Prevention Education: The patient [name] [name] has refused to provide written consent for family/significant other to be provided Family/Significant Other Suicide Prevention Education during admission and/or prior to discharge.  Physician notified.  SPE completed with patient, as patient refused to consent to family contact. SPI pamphlet provided to pt and pt was encouraged to share information with support network, ask questions, and talk about any concerns relating to SPE. Patient denies access to guns/firearms and verbalized understanding of information provided. Mobile Crisis information also provided to patient.    Maeola SarahJolan E Sanna Porcaro 11/20/2018, 11:42 AM

## 2018-11-20 NOTE — BHH Counselor (Signed)
Adult Comprehensive Assessment  Patient ID: [name] [name], male   DOB: 31-Dec-1970, 47 y.o.   MRN: [id number]  Information Source: Information source: Patient  Current Stressors:  Patient states their primary concerns and needs for treatment are:: "My depression" Patient states their goals for this hospitilization and ongoing recovery are:: "Anything that is better than this"  Educational / Learning stressors: N/A  Employment / Job issues: Unemployed; Patient reports he can easily find a part time job, however he does not have one at this time.  Family Relationships: Patient denies any stressors Financial / Lack of resources (include bankruptcy): No income and no [employer] insurance  Housing / Lack of housing: Patient reports living with his neice and cousin in [place]; Patient reports he does not plan on returning at discharge  Physical health (include injuries & life threatening diseases): Patient reports having DVT's  Social relationships: Patient denies any stressors  Substance abuse: Patient denies any stressors  Bereavement / Loss: Patient denies any stressors   Living/Environment/Situation:  Living Arrangements: Other relatives Living conditions (as described by patient or guardian): "It is okay"  Who else lives in the home?: "Cousin and neice"; However, patient reports there atre many individuals "coming in and out" of their home.  How long has patient lived in current situation?: Since March 2019 What is atmosphere in current home: Chaotic  Family History:  Marital status: Single Are you sexually active?: Yes What is your sexual orientation?: Heterosexual  Has your sexual activity been affected by drugs, alcohol, medication, or emotional stress?: No  Does patient have children?: Yes How many children?: 2 How is patient's relationship with their children?: Patient reports having a good relationship with his 47 year old twins.   Childhood History:  By whom was/is the  patient raised?: Both parents Description of patient's relationship with caregiver when they were a child: Patient reports having a good relationship with both of his parents during his childhood.  Patient's description of current relationship with people who raised him/her: Patient reports both of his parents are currently deceased  How were you disciplined when you got in trouble as a child/adolescent?: "I had to do more chores"  Does patient have siblings?: Yes Number of Siblings: 11 Description of patient's current relationship with siblings: Patient reports having a good relationshop with his 8 sisters and 3 brothers.  Did patient suffer any verbal/emotional/physical/sexual abuse as a child?: No Did patient suffer from severe childhood neglect?: No Has patient ever been sexually abused/assaulted/raped as an adolescent or adult?: No Was the patient ever a victim of a crime or a disaster?: No Witnessed domestic violence?: No Has patient been effected by domestic violence as an adult?: No  Education:  Highest grade of school patient has completed: 12th grade  Currently a student?: No Learning disability?: No  Employment/Work Situation:   Employment situation: Unemployed Patient's job has been impacted by current illness: No What is the longest time patient has a held a job?: 1 1/2 years  Where was the patient employed at that time?: [employer] [place] Did You Receive Any Psychiatric Treatment/Services While in the [place] No Are There Guns or Other Weapons in Your Home?: No  Financial Resources:   Financial resources: No income Does patient have a Lawyerrepresentative payee or guardian?: No  Alcohol/Substance Abuse:   What has been your use of drugs/alcohol within the last 12 months?: Patient denies  If attempted suicide, did drugs/alcohol play a role in this?: No Alcohol/Substance Abuse Treatment Hx:  Denies past history Has alcohol/substance abuse ever caused legal  problems?: No  Social Support System:   Conservation officer, natureatient's Community Support System: Fair Development worker, communityDescribe Community Support System: "My sister" Type of faith/religion: Christianity  How does patient's faith help to cope with current illness?: Prayer   Leisure/Recreation:   Leisure and Hobbies: "None"  Strengths/Needs:   What is the patient's perception of their strengths?: "Not right now, I don't know" Patient states they can use these personal strengths during their treatment to contribute to their recovery: To be determined  Patient states these barriers may affect/interfere with their treatment: No  Patient states these barriers may affect their return to the community: No   Discharge Plan:   Currently receiving community mental health services: No Patient states concerns and preferences for aftercare planning are: Patient reports he would like to be referred to an outpatient agency for medication management and therapy services.  Patient states they will know when they are safe and ready for discharge when: To be determined  Does patient have access to transportation?: Yes Does patient have financial barriers related to discharge medications?: Yes Patient description of barriers related to discharge medications: No income and no health insurance  Plan for living situation after discharge: Patient reports he plans on living with [place] friend [place] at discharge  Will patient be returning to same living situation after discharge?: No  Summary/Recommendations:   Summary and Recommendations (to be completed by the evaluator): [name] Cruiseapoleon is a 47 year old male who is diagnosed with Major Depressive Disorder. He presented to the hospital seeking treatment for worsening depressive symtpoms, auditory and visual hallucinations. During the assessment, [name] [name] was pleasant and cooperative with providing information. [name] [name] reports that hr came to the hospital seeking treatment for his DVT's however, he  expressed that he was also struggling with depression. [name] [name] states that he plans on moving back to the [place] area to stay with a friend at discharge. He reports that he would like to be referred to an outpatient provider for medication management and therapy services. Kire can benefri from crisis stabilization, medication management, therapeutic milieu and referral services.   Maeola SarahJolan E Kyrollos Cordell. 11/20/2018

## 2018-11-20 NOTE — Plan of Care (Signed)
  Problem: Education: Goal: Emotional status will improve Outcome: Not Progressing   Problem: Safety: Goal: Periods of time without injury will increase Outcome: Progressing  DAR NOTE: Patient presents with flat affect and depressed mood.  Endorses suicidal thoughts and auditory hallucination but verbally contracts for safety.  Rates depression at 10, hopelessness at 10, and anxiety at 10.  Maintained on routine safety checks.  Medications given as prescribed.  Support and encouragement offered as needed.  Attended group and participated.  States goal for today is "everything."  Patient is withdrawn and isolates to his room.  Only visible in milieu for meals and medications.  Forward little during assessment.  Patient is safe on and off the unit.

## 2018-11-20 NOTE — Progress Notes (Signed)
Patient ID: [name] [name], male   DOB: 01-17-1971, 47 y.o.   MRN: [id number]   D: Patient pleasant on approach tonight. Reports he has been depressed today. Talked about his sleeping issues and feels that not sleeping has caused increased irritability and increased depression. Denies any active SI at this time. Talked about being started on Cymbalta and Seroquel.   A: Staff will continues to monitor on q 15 minute checks, follow treatment plan, and give meds as ordered. R: Cooperative on the unit

## 2018-11-20 NOTE — [facility] Group Notes (Signed)
Adult Psychoeducational Group Note  Date:  11/20/2018 Time:  10:16 PM  Group Topic/Focus:  Wrap-Up Group:   The focus of this group is to help patients review their daily goal of treatment and discuss progress on daily workbooks.  Participation Level:  Active  Participation Quality:  Appropriate and Attentive  Affect:  Appropriate  Cognitive:  Alert and Appropriate  Insight: Appropriate and Good  Engagement in Group:  Engaged  Modes of Intervention:  Discussion and Education  Additional Comments:  Pt attended and participated in wrap up group this evening. Pt rated their day a 6/10, due to them getting some sleep, which was also the pt goal for the day.   [name] NettersOctavia A Jaylanie [name] 11/20/2018, 10:16 PM

## 2018-11-21 MED ORDER — QUETIAPINE FUMARATE 50 MG PO TABS
50.0000 mg | ORAL_TABLET | ORAL | Status: DC
  Administered 2018-11-22 – 2018-11-28 (×7): 50 mg via ORAL
  Filled 2018-11-21 (×7): qty 1
  Filled 2018-11-21: qty 7
  Filled 2018-11-21: qty 1

## 2018-11-21 MED ORDER — QUETIAPINE FUMARATE 200 MG PO TABS
200.0000 mg | ORAL_TABLET | Freq: Every day | ORAL | Status: DC
  Administered 2018-11-21: 200 mg via ORAL
  Filled 2018-11-21 (×4): qty 1

## 2018-11-21 NOTE — Progress Notes (Signed)
Pt observe in the dayroom, seen eating a snack. Pt appears depressed in affect and mood. Brightens on approach. Pt denies SI/HI/Pain at this time. Endorses AVH stating he hear "whispers" in which he can't make out and see "shadows". Seroquel was increase HS. No new c/o's. Pt states he has been in bed all day. Support and encouragement offered. Will continue with POC.

## 2018-11-21 NOTE — [facility] Group Notes (Addendum)
Adult Psychoeducational Group Note  Date:  11/21/2018 Time:  10:28 PM  Group Topic/Focus:  Wrap-Up Group:   The focus of this group is to help patients review their daily goal of treatment and discuss progress on daily workbooks.  Participation Level:  Active  Participation Quality:  Appropriate and Attentive  Affect:  Appropriate  Cognitive:  Alert and Appropriate  Insight: Appropriate and Good  Engagement in Group:  Engaged  Modes of Intervention:  Discussion and Education  Additional Comments:  Pt attended and participated in wrap up group this evening. Pt rated their day a 7/10, due to them getting rest today. Pt completed their goal, which was to get more rest.   [name] [name] 11/21/2018, 10:28 PM
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
## 2018-11-21 NOTE — Plan of Care (Signed)
  Problem: Coping: Goal: Ability to interact with others will improve Outcome: Progressing   D: Pt alert and oriented on the unit. Pt endorses passive SI and AVH. Pt isolated in his room for most of the day in bed sleeping. Pt woke up later during the afternoon and watched television in the day room with other pts. Pt is pleasant and cooperative. A: Education, support and encouragement provided, q15 minute safety checks remain in effect. Medications administered per MD orders. R: No reactions/side effects to medicine noted. Pt denies any concerns at this time, and verbally contracts for safety. Pt ambulating on the unit with no issues. Pt remains safe on and off the unit.

## 2018-11-22 MED ORDER — QUETIAPINE FUMARATE 300 MG PO TABS
300.0000 mg | ORAL_TABLET | Freq: Every day | ORAL | Status: DC
  Administered 2018-11-22: 300 mg via ORAL
  Filled 2018-11-22 (×3): qty 1

## 2018-11-22 NOTE — [facility] Group Notes (Signed)
[facility] Group Notes:  (Nursing/MHT/Case Management/Adjunct)  Date:  11/22/2018  Time:  4:00pm   Type of Therapy:  Nurse Education  Participation Level:  Minimal  Participation Quality:  Attentive  Affect:  Appropriate  Cognitive:  Alert  Insight:  Lacking  Engagement in Group:  Lacking  Modes of Intervention:  Activity, Discussion and Education  Summary of Progress/Problems: n/a  Kennyth Lose 11/22/2018, 6:12 PM

## 2018-11-22 NOTE — Progress Notes (Signed)
Patient ID: [name] [name], male   DOB: Jun 10, 1971, 48 y.o.   MRN: [id number] D: Patient in room on approach. Pt reports his day was "slow" but he is doing well. Pt reports he is tolerating medication well. Pt mood and affect appeared depressed and flat. Pt thought process is organized and behavior is appropriate. Pt denies SI/HI/AVH and pain. Cooperative with assessment. No acute distressed noted at this time.   A: Medications administered as prescribed. Support and encouragement provided to attend groups and engage in milieu. Pt encouraged to discuss feelings and come to staff with any question or concerns.   R: Patient remains safe and complaint with medications.
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
## 2018-11-22 NOTE — Progress Notes (Addendum)
Pt presents with a flat affect and depressed mood. Pt rates depression 9/10, anxiety 9/10, hopelessness 9/10. Pt endorses passive SI with no plan or intent. Pt denied active SI during shift assessment. Pt verbally contracts for safety. Pt reported difficulty sleeping last night but reported sleeping during the day yesterday. Pt stated goal "to think and remain positive and to think good thoughts". Pt hypertensive this am. B/p reassessed and Clinical research associate notified the MD and treatment team in progression meeting this am.   Medications reviewed with pt. Medications administered as ordered per MD. Verbal support provided. Pt encouraged to attend groups. 15 minute checks performed for safety.  Pt compliant with taking medications and no side effects verbalized by pt.

## 2018-11-22 NOTE — [facility] Group Notes (Signed)
LCSW Group Therapy Note 11/22/2018 4:04 PM  Type of Therapy/Topic: Group Therapy: Feelings about Diagnosis  Participation Level: Active   Description of Group:  This group will allow patients to explore their thoughts and feelings about diagnoses they have received. Patients will be guided to explore their level of understanding and acceptance of these diagnoses. Facilitator will encourage patients to process their thoughts and feelings about the reactions of others to their diagnosis and will guide patients in identifying ways to discuss their diagnosis with significant others in their lives. This group will be process-oriented, with patients participating in exploration of their own experiences, giving and receiving support, and processing challenge from other group members.  Therapeutic Goals: 1. Patient will demonstrate understanding of diagnosis as evidenced by identifying two or more symptoms of the disorder 2. Patient will be able to express two feelings regarding the diagnosis 3. Patient will demonstrate their ability to communicate their needs through discussion and/or role play  Summary of Patient Progress:  Adrean was engaged and participated throughout the group session. Quintus reports that he is scared to have a mental health diagnosis. Xxavier reports that it is scary because there is "no cure". Dmani reports that his diagnosis caused him to learn who his real family was.      Therapeutic Modalities:  Cognitive Behavioral Therapy Brief Therapy Feelings Identification    Ayse Mccartin Catalina Antigua Clinical Social Worker

## 2018-11-23 LAB — COMPREHENSIVE METABOLIC PANEL
ALT: 78 U/L — ABNORMAL HIGH (ref 0–44)
AST: 41 U/L (ref 15–41)
Albumin: 4 g/dL (ref 3.5–5.0)
Alkaline Phosphatase: 49 U/L (ref 38–126)
Anion gap: 11 (ref 5–15)
BUN: 20 mg/dL (ref 6–20)
CO2: 25 mmol/L (ref 22–32)
Calcium: 9.2 mg/dL (ref 8.9–10.3)
Chloride: 104 mmol/L (ref 98–111)
Creatinine, Ser: 1.1 mg/dL (ref 0.61–1.24)
GFR calc Af Amer: >60 mL/min (ref >60)
GFR calc non Af Amer: >60 mL/min (ref >60)
Glucose, Bld: 131 mg/dL — ABNORMAL HIGH (ref 70–99)
Potassium: 4.3 mmol/L (ref 3.5–5.1)
Sodium: 140 mmol/L (ref 135–145)
Total Bilirubin: 0.5 mg/dL (ref 0.3–1.2)
Total Protein: 6.8 g/dL (ref 6.5–8.1)

## 2018-11-23 MED ORDER — QUETIAPINE FUMARATE 400 MG PO TABS
400.0000 mg | ORAL_TABLET | Freq: Every day | ORAL | Status: DC
  Administered 2018-11-23 – 2018-11-27 (×5): 400 mg via ORAL
  Filled 2018-11-23 (×4): qty 1
  Filled 2018-11-23: qty 7
  Filled 2018-11-23 (×2): qty 2
  Filled 2018-11-23 (×2): qty 1

## 2018-11-23 MED ORDER — DULOXETINE HCL 60 MG PO CPEP
60.0000 mg | ORAL_CAPSULE | Freq: Every day | ORAL | Status: DC
  Administered 2018-11-24 – 2018-11-28 (×5): 60 mg via ORAL
  Filled 2018-11-23 (×5): qty 1
  Filled 2018-11-23: qty 7
  Filled 2018-11-23: qty 1

## 2018-11-23 MED ORDER — GABAPENTIN 300 MG PO CAPS
300.0000 mg | ORAL_CAPSULE | Freq: Two times a day (BID) | ORAL | Status: DC
  Administered 2018-11-23 – 2018-11-28 (×11): 300 mg via ORAL
  Filled 2018-11-23: qty 14
  Filled 2018-11-23 (×14): qty 1
  Filled 2018-11-23: qty 14

## 2018-11-23 MED ORDER — NAPROXEN 500 MG PO TABS
500.0000 mg | ORAL_TABLET | Freq: Once | ORAL | Status: DC
  Filled 2018-11-23 (×2): qty 1

## 2018-11-23 NOTE — Progress Notes (Signed)
D.  Pt pleasant on approach, denies complaints at this time.  Pt was positive for evening wrap up group, observed with minimal but appropriate interaction with peers on the unit.  Pt denies SI/HI/AVH but did report some audio  Hallucinations on day shift, improving.  A.  Support and encouragement offered, medications given as ordered  R.  Pt remains safe on the unit, will continue to monitor.
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
## 2018-11-23 NOTE — Tx Team (Signed)
Interdisciplinary Treatment and Diagnostic Plan Update  11/23/2018 Time of Session:  [facility] MRN: [id number]  Principal Diagnosis: MDD (major depressive disorder), severe ([name]  Secondary Diagnoses: Principal Problem:   MDD (major depressive disorder), severe ([name]   Current Medications:  Current Facility-Administered Medications  Medication Dose Route Frequency Provider Last Rate Last Dose  . alum & mag hydroxide-simeth (MAALOX/MYLANTA) 200-200-20 MG/5ML suspension 30 mL  30 mL Oral Q4H PRN Money, Gerlene Burdock, FNP      . DULoxetine (CYMBALTA) DR capsule 40 mg  40 mg Oral Daily Antonieta Pert, MD   40 mg at 11/23/18 0743  . feeding supplement (ENSURE ENLIVE) (ENSURE ENLIVE) liquid 237 mL  237 mL Oral BID BM Cobos, Rockey Situ, MD   237 mL at 11/22/18 1902  . hydrOXYzine (ATARAX/VISTARIL) tablet 25 mg  25 mg Oral TID PRN Money, Gerlene Burdock, FNP   25 mg at 11/22/18 1907  . magnesium hydroxide (MILK OF MAGNESIA) suspension 30 mL  30 mL Oral Daily PRN Money, Gerlene Burdock, FNP      . QUEtiapine (SEROQUEL) tablet 300 mg  300 mg Oral QHS Aldean Baker, NP   300 mg at 11/22/18 2222  . QUEtiapine (SEROQUEL) tablet 50 mg  50 mg Oral Wilma Flavin, MD   50 mg at 11/23/18 0743  . Rivaroxaban (XARELTO) tablet 15 mg  15 mg Oral BID Money, Gerlene Burdock, FNP   15 mg at 11/23/18 0743  . traZODone (DESYREL) tablet 50 mg  50 mg Oral QHS PRN Money, Gerlene Burdock, FNP   50 mg at 11/22/18 2222   PTA Medications: Medications Prior to Admission  Medication Sig Dispense Refill Last Dose  . nitroGLYCERIN (NITROSTAT) 0.4 MG SL tablet Place 0.4 mg under the tongue every 5 (five) minutes as needed for chest pain.   unknown  . Rivaroxaban (XARELTO) 20 MG TABS tablet Take 1 tablet (20 mg total) by mouth daily with supper. (Patient not taking: Reported on 05/31/2018) 30 tablet 0 Not Taking at Unknown time    Patient Stressors: Health problems Marital or family conflict Medication change or  noncompliance Occupational concerns  Patient Strengths: Ability for insight Capable of independent living Communication skills Supportive family/friends  Treatment Modalities: Medication Management, Group therapy, Case management,  1 to 1 session with clinician, Psychoeducation, Recreational therapy.   Physician Treatment Plan for Primary Diagnosis: MDD (major depressive disorder), severe ([name] Long Term Goal(s): Improvement in symptoms so as ready for discharge   Short Term Goals: Ability to identify changes in lifestyle to reduce recurrence of condition will improve Ability to verbalize feelings will improve Ability to disclose and discuss suicidal ideas Ability to demonstrate self-control will improve Ability to identify and develop effective coping behaviors will improve Ability to maintain clinical measurements within normal limits will improve Ability to identify changes in lifestyle to reduce recurrence of condition will improve Ability to maintain clinical measurements within normal limits will improve  Medication Management: Evaluate patient's response, side effects, and tolerance of medication regimen.  Therapeutic Interventions: 1 to 1 sessions, Unit Group sessions and Medication administration.  Evaluation of Outcomes: Progressing  Physician Treatment Plan for Secondary Diagnosis: Principal Problem:   MDD (major depressive disorder), severe ([name]  Long Term Goal(s): Improvement in symptoms so as ready for discharge   Short Term Goals: Ability to identify changes in lifestyle to reduce recurrence of condition will improve Ability to verbalize feelings will improve Ability to disclose and discuss suicidal ideas Ability to demonstrate  self-control will improve Ability to identify and develop effective coping behaviors will improve Ability to maintain clinical measurements within normal limits will improve Ability to identify changes in lifestyle to reduce recurrence  of condition will improve Ability to maintain clinical measurements within normal limits will improve     Medication Management: Evaluate patient's response, side effects, and tolerance of medication regimen.  Therapeutic Interventions: 1 to 1 sessions, Unit Group sessions and Medication administration.  Evaluation of Outcomes: Progressing   RN Treatment Plan for Primary Diagnosis: MDD (major depressive disorder), severe ([name] Long Term Goal(s): Knowledge of disease and therapeutic regimen to maintain health will improve  Short Term Goals: Ability to participate in decision making will improve, Ability to verbalize feelings will improve, Ability to disclose and discuss suicidal ideas and Ability to identify and develop effective coping behaviors will improve  Medication Management: RN will administer medications as ordered by provider, will assess and evaluate patient's response and provide education to patient for prescribed medication. RN will report any adverse and/or side effects to prescribing provider.  Therapeutic Interventions: 1 on 1 counseling sessions, Psychoeducation, Medication administration, Evaluate responses to treatment, Monitor vital signs and CBGs as ordered, Perform/monitor CIWA, COWS, AIMS and Fall Risk screenings as ordered, Perform wound care treatments as ordered.  Evaluation of Outcomes: Progressing   LCSW Treatment Plan for Primary Diagnosis: MDD (major depressive disorder), severe ([name] Long Term Goal(s): Safe transition to appropriate next level of care at discharge, Engage patient in therapeutic group addressing interpersonal concerns.  Short Term Goals: Engage patient in aftercare planning with referrals and resources  Therapeutic Interventions: Assess for all discharge needs, 1 to 1 time with Social worker, Explore available resources and support systems, Assess for adequacy in community support network, Educate family and significant other(s) on suicide  prevention, Complete Psychosocial Assessment, Interpersonal group therapy.  Evaluation of Outcomes: Adequate for Discharge   Progress in Treatment: Attending groups: Yes. Participating in groups: Yes. Taking medication as prescribed: Yes. Toleration medication: Yes. Family/Significant other contact made: No, will contact:  patient declined collateral contacts Patient understands diagnosis: Yes. Discussing patient identified problems/goals with staff: Yes. Medical problems stabilized or resolved: Yes. Denies suicidal/homicidal ideation: Yes. Issues/concerns per patient self-inventory: No. Other:   New problem(s) identified: None   New Short Term/Long Term Goal(s):  medication stabilization, elimination of SI thoughts, development of comprehensive mental wellness plan.    Patient Goals: I get angry easily, my depression has gotten worse especially with my DVTs   Discharge Plan or Barriers: Patient plans [place] in [place]. [facility] medication management and therapy services.    Reason for Continuation of Hospitalization: Depression Medication stabilization Suicidal ideation  Estimated Length of Stay: 3-5 days    Attendees: Patient: 11/23/2018 8:33 AM  Physician: Dr. Nehemiah MassedFernando Cobos, MD 11/23/2018 8:33 AM  Nursing: Huntley DecSara. Elbert EwingsL, RN 11/23/2018 8:33 AM  RN Care Manager: 11/23/2018 8:33 AM  Social Worker: Baldo DaubJolan Philip Eckersley, LCSWA 11/23/2018 8:33 AM  Recreational Therapist:  11/23/2018 8:33 AM  Other: Beverly GustJane Sykes, NP 11/23/2018 8:33 AM  Other:  11/23/2018 8:33 AM  Other: 11/23/2018 8:33 AM    Scribe for Treatment Team: Maeola SarahJolan E Adeli Frost, LCSWA 11/23/2018 8:33 AM

## 2018-11-23 NOTE — Progress Notes (Signed)
D Patient is seen out in the milieu on the 400 hall. HE is reserved, quiet, and guarded. HE makes brief eye contact. He speaks  Very quietly, almost inaudibly and says " its better this evening".      A HE endorses a flat, depressed affect . He sits frequently in the 400 hall dayroom, watching the other patients and watching TV, but he prefers to watch others more than interact with them. HE shares with Clinical research associate " I think my anxiety is really bad". He completed his daily assessment and on this he wrote he has experienced SI today and he rated his depression , hopelessness and anxiety " 8/8/9", respectively.       R Safety is in place and poc cont.

## 2018-11-23 NOTE — Progress Notes (Signed)
Adult Psychoeducational Group Note  Date:  11/23/2018 Time:  4:16 AM  Group Topic/Focus:  Wrap-Up Group:   The focus of this group is to help patients review their daily goal of treatment and discuss progress on daily workbooks.  Participation Level:  Active  Participation Quality:  Attentive  Affect:  Appropriate  Cognitive:  Alert and Appropriate  Insight: Good  Engagement in Group:  Engaged  Modes of Intervention:  Discussion  Additional Comments:  Pt said his day was a 7. Pt said the one positive thing that happened he slept, he talk o the doctor chang  Charna Busman Long 11/23/2018, 4:16 AM

## 2018-11-24 NOTE — Progress Notes (Signed)
Patient rated his day as a 6 out of 10 since he didn't receive the medication that he had expected. His goal for tomorrow is to work on "moderating" his anxiety.

## 2018-11-24 NOTE — Progress Notes (Signed)
D: Pt alert and oriented on the unit. Pt engaging with RN staff and other pts. Pt denies SI/HI, AH, but says he "sees shadows." Pt sat in the dayroom and watched television with other pts and participated during unit groups and activities. Pt is pleasant and cooperative. Pt's goal is "to think more positive and stop feeling so down." A: Education, support and encouragement provided, q15 minute safety checks remain in effect. Medications administered per MD orders. R: No reactions/side effects to medicine noted. Pt denies any concerns at this time, and verbally contracts for safety. Pt ambulating on the unit with no issues. Pt remains safe on and off the unit.

## 2018-11-24 NOTE — Progress Notes (Signed)
Pt on unit in day room.  Pt denies pain or discomfort.  Pt denies SI, HI and AVH and verbally contracts for safety.  Pt attends group and is med compliant.  Pt rates is anxiety level a 6 and sts nothing bad happened. Pt offered support and encouragement. Pt remains safe on unit.

## 2018-11-24 NOTE — [facility] Group Notes (Signed)
LCSW Group Therapy Note  11/24/2018    10:00-11:00am   Type of Therapy and Topic:  Group Therapy: Early Messages Received About Anger  Participation Level:  Minimal   Description of Group:   In this group, patients shared and discussed the early messages received in their lives about anger through parental or other adult modeling, teaching, repression, punishment, violence, and more.  Participants identified how those childhood lessons influence even now how they usually or often react when angered.  The group discussed that anger is a secondary emotion and what may be the underlying emotional themes that come out through anger outbursts or that are ignored through anger suppression.  Finally, as a group there was a conversation about the workbook's quote that "There is nothing wrong with anger; it is just a sign something needs to change."     Therapeutic Goals: 1. Patients will identify one or more childhood message about anger that they received and how it was taught to them. 2. Patients will discuss how these childhood experiences have influenced and continue to influence their own expression or repression of anger even today. 3. Patients will explore possible primary emotions that tend to fuel their secondary emotion of anger. 4. Patients will learn that anger itself is normal and cannot be eliminated, and that healthier coping skills can assist with resolving conflict rather than worsening situations.  Summary of Patient Progress:  The patient shared that his childhood lessons about anger were cursing and yelling.  As a result, he just walks away from people, stated he does not like people much.  He stated this stops his anger as long as he does not see the person who angered him, but if he does by chance the person, he will hurt them.  Therapeutic Modalities:   Cognitive Behavioral Therapy Motivation Interviewing  Lynnell Chad  .
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
## 2018-11-25 MED ORDER — TRAZODONE HCL 100 MG PO TABS
100.0000 mg | ORAL_TABLET | Freq: Every day | ORAL | Status: DC
  Filled 2018-11-25 (×2): qty 1

## 2018-11-25 MED ORDER — NAPROXEN 500 MG PO TABS
500.0000 mg | ORAL_TABLET | Freq: Once | ORAL | Status: AC
  Administered 2018-11-25: 500 mg via ORAL
  Filled 2018-11-25: qty 1

## 2018-11-25 MED ORDER — TRAZODONE HCL 150 MG PO TABS
150.0000 mg | ORAL_TABLET | Freq: Every day | ORAL | Status: DC
  Administered 2018-11-25 – 2018-11-27 (×3): 150 mg via ORAL
  Filled 2018-11-25: qty 7
  Filled 2018-11-25 (×5): qty 1

## 2018-11-25 NOTE — Plan of Care (Signed)
Nurse discussed anxiety, depression, coping skills with patient. 

## 2018-11-25 NOTE — Progress Notes (Signed)
Adult Psychoeducational Group Note  Date:  11/25/2018 Time:  10:29 AM  Group Topic/Focus:  Orientation:   The focus of this group is to educate the patient on the purpose and policies of crisis stabilization and provide a format to answer questions about their admission.  The group details unit policies and expectations of patients while admitted.  Participation Level:  Active  Participation Quality:  Appropriate and Attentive  Affect:  Appropriate  Cognitive:  Alert  Insight: Appropriate  Engagement in Group:  Engaged  Modes of Intervention:  Discussion and Education  Additional Comments:    Pt attended group with the MHT. Pt's goal today is to work on his anxiety. Pt plans to accomplish his goal by taking his medication and attending group. Pt asked in group about what causes anxiety. Staff discussed triggers and coping skills for anxiety with the Pt.   Karren Cobble 11/25/2018, 10:29 AM

## 2018-11-25 NOTE — Progress Notes (Addendum)
D:  Patient's self inventory sheet, patient has fair sleep, sleep medication helpful.  Poor appetite, low energy level, good concentration.  Rated depression, hopeless 7, anxiety 10.  Denied withdrawals.  SI, contracts for safety, no plan.  Physical problems, lightheaded, pain, dizziness, headaches, blurred vision.  Physical pain, R leg DVT, worst pain #9 in past 24 hours.  No pain medicine.  Goal is anxiety and depression.  Plans to attend meetings.  "I'm very low today!"  No discharge plans. A:  Medications administered per MD orders.  Emotional support and encouragement given patient. R:   Denied SI while talking to nurse this morning.  Checked SI off/on on self inventory sheet.  Also patient denied HI.  Denied A/V hallucinations while talking to nurse. Patient has not requested pain medications, and patient has not complained of pain today to staff.

## 2018-11-25 NOTE — [facility] Group Notes (Signed)
[facility] LCSW Group Therapy Note  11/25/2018  10:00-11:00AM  Type of Therapy and Topic:  Group Therapy:  Adding Supports Including Being Your Own Support  Participation Level:  Minimal   Description of Group:  Patients in this group were introduced to the concept that additional supports including self-support are an essential part of recovery.  A song entitled "Breaking Down" was played and a group discussion was held in reaction to the idea of needing to add supports.  A song entitled "My Own Hero" was played and a group discussion ensued in which patients stated they could relate to the song and it inspired them to realize they have be willing to help themselves in order to succeed, because other people cannot achieve sobriety or stability for them.  We discussed adding a variety of healthy supports to address the various needs in their lives.    Therapeutic Goals: 1)  demonstrate the importance of being a part of one's own support system 2)  discuss reasons people in one's life may eventually be unable to be continually supportive  3)  identify the patient's current support system and   4)  elicit commitments to add healthy supports and to become more conscious of being self-supportive   Summary of Patient Progress:  The patient expressed little during group except in response to the two songs when he did share that they were very relatable.  He had his eyes closed and appeared even to doze off at times.   Therapeutic Modalities:   Motivational Interviewing Activity  Lynnell ChadMareida J Grossman-Orr

## 2018-11-25 NOTE — [facility] Group Notes (Signed)
[facility] Group Notes:  (Nursing/MHT/Case Management/Adjunct)  Date:  11/25/2018  Time:  4:15 pm  Type of Therapy:  Psychoeducational Skills  Participation Level:  Active  Participation Quality:  Appropriate  Affect:  Appropriate  Cognitive:  Appropriate  Insight:  Appropriate  Engagement in Group:  Engaged  Modes of Intervention:  Education  Summary of Progress/Problems:  Patient was alert and active during group.   Earline Mayotte 11/25/2018, 6:56 PM
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
## 2018-11-25 NOTE — Progress Notes (Signed)
Adult Psychoeducational Group Note  Date:  11/25/2018 Time:  10:25 PM  Group Topic/Focus:  Wrap-Up Group:   The focus of this group is to help patients review their daily goal of treatment and discuss progress on daily workbooks.  Participation Level:  Active  Participation Quality:  Appropriate  Affect:  Appropriate  Cognitive:  Appropriate  Insight: Appropriate  Engagement in Group:  Engaged  Modes of Intervention:  Discussion  Additional Comments:  Patient attended group and said that her day was a 6.  He described his day as being content.   Elease Swarm W Ginni Eichler 11/25/2018, 10:25 PM
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
## 2018-11-26 NOTE — [facility] Group Notes (Signed)
LCSW Group Therapy Note 11/26/2018 11:35 AM  Type of Therapy and Topic: Group Therapy: Overcoming Obstacles  Participation Level: Active  Description of Group:  In this group patients will be encouraged to explore what they see as obstacles to their own wellness and recovery. They will be guided to discuss their thoughts, feelings, and behaviors related to these obstacles. The group will process together ways to cope with barriers, with attention given to specific choices patients can make. Each patient will be challenged to identify changes they are motivated to make in order to overcome their obstacles. This group will be process-oriented, with patients participating in exploration of their own experiences as well as giving and receiving support and challenge from other group members.  Therapeutic Goals: 1. Patient will identify personal and current obstacles as they relate to admission. 2. Patient will identify barriers that currently interfere with their wellness or overcoming obstacles.  3. Patient will identify feelings, thought process and behaviors related to these barriers. 4. Patient will identify two changes they are willing to make to overcome these obstacles:   Summary of Patient Progress  Kaizen was engaged and participated throughout the group session. Jahaziel reports that his main obstacle is "my conscious" . Kelijah reports that he tends to isolate from people and that he eventually feels lonely. Callan states that learning that he cannot control everything will help him overcome this obstacle.     Therapeutic Modalities:  Cognitive Behavioral Therapy Solution Focused Therapy Motivational Interviewing Relapse Prevention Therapy   Alcario Drought Clinical Social Worker

## 2018-11-26 NOTE — Progress Notes (Signed)
Patient ID: [name] [name], male   DOB: 12-25-1970, 48 y.o.   MRN: [id number]   D: Patient denies SI/HI and auditory and visual hallucinations. Patient has complained of anxiety today and required Vistaril.  A: Patient given emotional support from RN. Patient given medications per MD orders. Patient encouraged to attend groups and unit activities. Patient encouraged to come to staff with any questions or concerns.  R: Patient remains cooperative and appropriate. Will continue to monitor patient for safety.

## 2018-11-26 NOTE — Progress Notes (Signed)
Recreation Therapy Notes  Date: 1.6.20 Time: 0930 Location: 300 Hall Dayroom  Group Topic: Stress Management  Goal Area(s) Addresses:  Patient will engage in healthy stress management techniques. Patient will be able to identify positive stress management techniques.  Intervention: Stress Management  Activity : Meditation.  LRT introduced the stress management technique of meditation.  LRT played a meditation that focused on looking at each day as a new beginning.  Patients were to follow along as meditation was played in order to engage in activity.  Education:  Stress Management, Discharge Planning.   Education Outcome: Acknowledges Education  Clinical Observations/Feedback: Pt did not attend group.    Caroll Rancher, LRT/CTRS         Lillia Abed, Radonna Bracher A 11/26/2018 10:50 AM

## 2018-11-26 NOTE — Progress Notes (Signed)
Pt currently expresses pain in right knee upon assessment.  Pt does deny SDI, HI and AVH.  Pt sts he had an ok day, but says he feels "low " at times, has attended all groups and is medication compliant. PRN pain medication administered per provider orders.  Pt offered support and encouragement. Pt remains safe on unit.

## 2018-11-27 NOTE — Progress Notes (Signed)
D: Pt denies SI/HI/AVH. Pt is pleasant and cooperative. Pt seen joking with pers in the dayroom this evening A: Pt was offered support and encouragement. Pt was given scheduled medications. Pt was encourage to attend groups. Q 15 minute checks were done for safety.  R:Pt attends groups and interacts well with peers and staff. Pt is taking medication. Pt has no complaints.Pt receptive to treatment and safety maintained on unit.

## 2018-11-27 NOTE — Progress Notes (Signed)
D: Pt denies SI/HI/AVH. Pt is pleasant and cooperative. Pt stated he was doing great, pt ready to D/C tomorrow.  A: Pt was offered support and encouragement. Pt was given scheduled medications. Pt was encourage to attend groups. Q 15 minute checks were done for safety.  R:Pt attends groups and interacts well with peers and staff. Pt is taking medication. Pt has no complaints.Pt receptive to treatment and safety maintained on unit.

## 2018-11-27 NOTE — [facility] Group Notes (Signed)
Adult Psychoeducational Group Note  Date:  11/27/2018 Time:  9:04 AM  Group Topic/Focus:  Orientation:   The focus of this group is to educate the patient on the purpose and policies of crisis stabilization and provide a format to answer questions about their admission.  The group details unit policies and expectations of patients while admitted.  Participation Level:  Active  Participation Quality:  Appropriate  Affect:  Appropriate  Cognitive:  Alert  Insight: Appropriate  Engagement in Group:  Engaged  Modes of Intervention:  Orientation  Additional Comments:   Pt participated in orientation group facilitated by MHT Macdilla W.  Dellia Nims 11/27/2018, 9:04 AM

## 2018-11-27 NOTE — Progress Notes (Signed)
[facility] Group Notes:  (Nursing/MHT/Case Management/Adjunct)  Date:  11/27/2018  Time:  1515  Type of Therapy:  Nurse Education  Participation Level:  Active  Participation Quality:  Appropriate  Affect:  Appropriate  Cognitive:  Appropriate  Insight:  Appropriate  Engagement in Group:  Engaged  Modes of Intervention:  Activity   
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
## 2018-11-27 NOTE — Progress Notes (Signed)
Progress note  D: pt found in bed; compliant with medication administration. Pt states he slept fair. Pt rates his depression/hopelessness/anxiety a 9/8/9 out of 10 respectively. Pt states he is suffering from headaches, a rash, lightheadedness, dizziness and pain which he failed to rate, but stated he thinks he has a DVT in his R leg. Pt put this on his self inventory but denied this to this Clinical research associate. Pt states his goal for today is to work on his exit plan and he will achieve this by working with his case Financial controller. Pt denies any si/hi/ah/vh and verbally agrees to approach staff if these become apparent or before harming himself or others while at [facility].  A: pt provided support and encouragement. Pt given medication per protocol and standing orders. q101m safety checks implemented and continued.  R: pt safe on the unit. Will continue to monitor.

## 2018-11-28 MED ORDER — HYDROXYZINE HCL 25 MG PO TABS: 25.0000 mg | ORAL_TABLET | Freq: Three times a day (TID) | ORAL | 0 refills | Status: DC | PRN

## 2018-11-28 MED ORDER — TRAZODONE HCL 150 MG PO TABS: 150.0000 mg | ORAL_TABLET | Freq: Every day | ORAL | 0 refills | Status: DC

## 2018-11-28 MED ORDER — QUETIAPINE FUMARATE 400 MG PO TABS: 400.0000 mg | ORAL_TABLET | Freq: Every day | ORAL | 0 refills | Status: DC

## 2018-11-28 MED ORDER — QUETIAPINE FUMARATE 50 MG PO TABS: 50.0000 mg | ORAL_TABLET | ORAL | 0 refills | Status: DC

## 2018-11-28 MED ORDER — DULOXETINE HCL 60 MG PO CPEP: 60.0000 mg | ORAL_CAPSULE | Freq: Every day | ORAL | 0 refills | Status: DC

## 2018-11-28 MED ORDER — RIVAROXABAN 20 MG PO TABS
20.0000 mg | ORAL_TABLET | Freq: Every day | ORAL | Status: DC
  Filled 2018-11-28 (×2): qty 7

## 2018-11-28 MED ORDER — GABAPENTIN 300 MG PO CAPS: 300.0000 mg | ORAL_CAPSULE | Freq: Two times a day (BID) | ORAL | 0 refills | Status: DC

## 2018-11-28 MED ORDER — RIVAROXABAN 15 MG PO TABS: 15.0000 mg | ORAL_TABLET | Freq: Two times a day (BID) | ORAL | 0 refills | Status: DC

## 2018-11-28 MED ORDER — RIVAROXABAN 20 MG PO TABS: 20.0000 mg | ORAL_TABLET | Freq: Every day | ORAL | 0 refills | Status: DC

## 2018-11-28 NOTE — Tx Team (Signed)
Interdisciplinary Treatment and Diagnostic Plan Update  11/28/2018 Time of Session:  [name] [name] MRN: [id number]  Principal Diagnosis: MDD (major depressive disorder), severe ([name]  Secondary Diagnoses: Principal Problem:   MDD (major depressive disorder), severe ([name]   Current Medications:  Current Facility-Administered Medications  Medication Dose Route Frequency Provider Last Rate Last Dose  . alum & mag hydroxide-simeth (MAALOX/MYLANTA) 200-200-20 MG/5ML suspension 30 mL  30 mL Oral Q4H PRN Money, Gerlene Burdock, FNP   30 mL at 11/27/18 2227  . DULoxetine (CYMBALTA) DR capsule 60 mg  60 mg Oral Daily Antonieta Pert, MD   60 mg at 11/28/18 0751  . feeding supplement (ENSURE ENLIVE) (ENSURE ENLIVE) liquid 237 mL  237 mL Oral BID BM Cobos, Rockey Situ, MD   237 mL at 11/27/18 1952  . gabapentin (NEURONTIN) capsule 300 mg  300 mg Oral BID Antonieta Pert, MD   300 mg at 11/28/18 0751  . hydrOXYzine (ATARAX/VISTARIL) tablet 25 mg  25 mg Oral TID PRN Money, Gerlene Burdock, FNP   25 mg at 11/28/18 0758  . magnesium hydroxide (MILK OF MAGNESIA) suspension 30 mL  30 mL Oral Daily PRN Money, Gerlene Burdock, FNP      . naproxen (NAPROSYN) tablet 500 mg  500 mg Oral Once Nira Conn A, NP      . QUEtiapine (SEROQUEL) tablet 400 mg  400 mg Oral QHS Antonieta Pert, MD   400 mg at 11/27/18 2141  . QUEtiapine (SEROQUEL) tablet 50 mg  50 mg Oral Wilma Flavin, MD   50 mg at 11/28/18 0751  . Rivaroxaban (XARELTO) tablet 15 mg  15 mg Oral BID Money, Gerlene Burdock, FNP   15 mg at 11/28/18 0751  . traZODone (DESYREL) tablet 150 mg  150 mg Oral QHS Armandina Stammer I, NP   150 mg at 11/27/18 2141   PTA Medications: Medications Prior to Admission  Medication Sig Dispense Refill Last Dose  . nitroGLYCERIN (NITROSTAT) 0.4 MG SL tablet Place 0.4 mg under the tongue every 5 (five) minutes as needed for chest pain.   unknown  . [DISCONTINUED] Rivaroxaban (XARELTO) 20 MG TABS tablet Take 1 tablet (20 mg total)  by mouth daily with supper. (Patient not taking: Reported on 05/31/2018) 30 tablet 0 Not Taking at Unknown time    Patient Stressors: Health problems Marital or family conflict Medication change or noncompliance Occupational concerns  Patient Strengths: Ability for insight Capable of independent living Communication skills Supportive family/friends  Treatment Modalities: Medication Management, Group therapy, Case management,  1 to 1 session with clinician, Psychoeducation, Recreational therapy.   Physician Treatment Plan for Primary Diagnosis: MDD (major depressive disorder), severe ([name] Long Term Goal(s): Improvement in symptoms so as ready for discharge   Short Term Goals: Ability to identify changes in lifestyle to reduce recurrence of condition will improve Ability to verbalize feelings will improve Ability to disclose and discuss suicidal ideas Ability to demonstrate self-control will improve Ability to identify and develop effective coping behaviors will improve Ability to maintain clinical measurements within normal limits will improve Ability to identify changes in lifestyle to reduce recurrence of condition will improve Ability to maintain clinical measurements within normal limits will improve  Medication Management: Evaluate patient's response, side effects, and tolerance of medication regimen.  Therapeutic Interventions: 1 to 1 sessions, Unit Group sessions and Medication administration.  Evaluation of Outcomes: Adequate for Discharge  Physician Treatment Plan for Secondary Diagnosis: Principal Problem:   MDD (major depressive disorder),  severe ([name]  Long Term Goal(s): Improvement in symptoms so as ready for discharge   Short Term Goals: Ability to identify changes in lifestyle to reduce recurrence of condition will improve Ability to verbalize feelings will improve Ability to disclose and discuss suicidal ideas Ability to demonstrate self-control will  improve Ability to identify and develop effective coping behaviors will improve Ability to maintain clinical measurements within normal limits will improve Ability to identify changes in lifestyle to reduce recurrence of condition will improve Ability to maintain clinical measurements within normal limits will improve     Medication Management: Evaluate patient's response, side effects, and tolerance of medication regimen.  Therapeutic Interventions: 1 to 1 sessions, Unit Group sessions and Medication administration.  Evaluation of Outcomes: Adequate for Discharge   RN Treatment Plan for Primary Diagnosis: MDD (major depressive disorder), severe ([name] Long Term Goal(s): Knowledge of disease and therapeutic regimen to maintain health will improve  Short Term Goals: Ability to participate in decision making will improve, Ability to verbalize feelings will improve, Ability to disclose and discuss suicidal ideas and Ability to identify and develop effective coping behaviors will improve  Medication Management: RN will administer medications as ordered by provider, will assess and evaluate patient's response and provide education to patient for prescribed medication. RN will report any adverse and/or side effects to prescribing provider.  Therapeutic Interventions: 1 on 1 counseling sessions, Psychoeducation, Medication administration, Evaluate responses to treatment, Monitor vital signs and CBGs as ordered, Perform/monitor CIWA, COWS, AIMS and Fall Risk screenings as ordered, Perform wound care treatments as ordered.  Evaluation of Outcomes: Adequate for Discharge   LCSW Treatment Plan for Primary Diagnosis: MDD (major depressive disorder), severe ([name] Long Term Goal(s): Safe transition to appropriate next level of care at discharge, Engage patient in therapeutic group addressing interpersonal concerns.  Short Term Goals: Engage patient in aftercare planning with referrals and  resources  Therapeutic Interventions: Assess for all discharge needs, 1 to 1 time with Social worker, Explore available resources and support systems, Assess for adequacy in community support network, Educate family and significant other(s) on suicide prevention, Complete Psychosocial Assessment, Interpersonal group therapy.  Evaluation of Outcomes: Adequate for Discharge   Progress in Treatment: Attending groups: Yes. Participating in groups: Yes. Taking medication as prescribed: Yes. Toleration medication: Yes. Family/Significant other contact made: No, will contact:  patient declined collateral contacts Patient understands diagnosis: Yes. Discussing patient identified problems/goals with staff: Yes. Medical problems stabilized or resolved: Yes. Denies suicidal/homicidal ideation: Yes. Issues/concerns per patient self-inventory: No. Other:   New problem(s) identified: None   New Short Term/Long Term Goal(s):  medication stabilization, elimination of SI thoughts, development of comprehensive mental wellness plan.    Patient Goals: I get angry easily, my depression has gotten worse especially with my DVTs   Discharge Plan or Barriers: Patient plans to discharge home with a friend in [place], [place]. He will follow up with [facility] for outpatient medication management and therapy services.    Reason for Continuation of Hospitalization: Depression Medication stabilization Suicidal ideation  Estimated Length of Stay: 3-5 days    Attendees: Patient: 11/28/2018 10:45 AM  Physician: Dr. Nehemiah Massed, MD 11/28/2018 10:45 AM  Nursing: Huntley Dec. L, RN; Ethelene Browns.A, RN 11/28/2018 10:45 AM  RN Care Manager: 11/28/2018 10:45 AM  Social Worker: Baldo Daub, LCSWA 11/28/2018 10:45 AM  Recreational Therapist:  11/28/2018 10:45 AM  Other: Beverly Gust, NP 11/28/2018 10:45 AM  Other:  11/28/2018 10:45 AM  Other: 11/28/2018 10:45 AM    Scribe  for Treatment Team: Mariah MillingJolan E Valari Taylor, LCSWA 11/28/2018 10:45 AM

## 2018-11-28 NOTE — [facility] Suicide Risk Assessment (Addendum)
[facility] Discharge Suicide Risk Assessment   Principal Problem: MDD (major depressive disorder), severe ([name] Discharge Diagnoses: Principal Problem:   MDD (major depressive disorder), severe ([name]   Total Time spent with patient: 30 minutes  Musculoskeletal: Strength & Muscle Tone: within normal limits Gait & Station: normal Patient leans: N/A  Psychiatric Specialty Exam: ROS  No headache, no chest pain, no shortness of breath, no vomiting   Blood pressure (!) 89/58, pulse (!) 104, temperature 98 F (36.7 C), temperature source Oral, resp. rate 20, height 6' (1.829 m), weight 82.1 kg, SpO2 93 %.Body mass index is 24.55 kg/m.  General Appearance: Well Groomed  Patent attorney::  Good  Speech:  Normal Rate409  Volume:  Normal  Mood:  improving mood , presents euthymic  Affect:  Appropriate and Full Range  Thought Process:  Linear and Descriptions of Associations: Intact  Orientation:  Full (Time, Place, and Person)  Thought Content:  reports improved hallucinations, reports occasional distant " whispers " he cannot  understand, and which no longer concern him. No delusions expressed, does not appear internally preoccupied  Suicidal Thoughts:  No denies suicidal or self injurious ideations, denies any homicidal or violent ideations  Homicidal Thoughts:  No  Memory:  recent and remote grossly intact   Judgement:  Other:  improving   Insight:  improving   Psychomotor Activity:  Normal  Concentration:  Good  Recall:  Good  Fund of Knowledge:Good  Language: Good  Akathisia:  Negative  Handed:  Right  AIMS (if indicated):   no abnormal or involuntary movements noted or reported  Assets:  Desire for Improvement Resilience  Sleep:  Number of Hours: 6.25  Cognition: WNL  ADL's:  Intact   Mental Status Per Nursing Assessment::   On Admission:  NA  Demographic Factors:  47, single, has two adult twins, plans to go live with a cousin for a period of time   Loss Factors: Unstable  housing , chronic medical illness   Historical Factors: History of mood disorder, history of depression, history of intermittent hallucinations  Risk Reduction Factors:   Positive coping skills or problem solving skills  Continued Clinical Symptoms:  At this time patient is alert, attentive, well related, calm, mood improved , reports feeling significantly better than on admission, affect appropriate, reactive, no thought disorder, no suicidal or self injurious ideations, no homicidal or violent ideations, reports much improved but not entirely resolved hallucinations. No delusions expressed, not internally preoccupied, future oriented . Behavior on unit calm and in good control, pleasant on approach. Denies medication side effects. We reviewed medication side effects, including risk of sedation, weight gain, metabolic or movement disorder.   Cognitive Features That Contribute To Risk:  No gross cognitive deficits noted upon discharge. Is alert , attentive, and oriented x 3     Suicide Risk:  Mild:  Suicidal ideation of limited frequency, intensity, duration, and specificity.  There are no identifiable plans, no associated intent, mild dysphoria and related symptoms, good self-control (both objective and subjective assessment), few other risk factors, and identifiable protective factors, including available and accessible social support.  Follow-up Information    [name]. Go on 11/28/2018.   Why:  Please attend your hospital discharge appointment 11/28/2018 at 8:00am. Contact information: [address] [place] [address] [phone]           Plan Of Care/Follow-up recommendations:  Activity:  as tolerated  Diet:  regular Tests:  NA Other:  See below  Patient is expressing  readiness for discharge and is leaving unit in good spirits. Plans to go to live with cousin for a period of time Follow up as above  Referred to [facility] for medical management as needed .    Craige Cotta, MD 11/28/2018, 9:57 AM

## 2018-11-28 NOTE — Progress Notes (Signed)
Recreation Therapy Notes  Date: 1.8.20 Time: 0930 Location: 300 Hall Dayroom  Group Topic: Stress Management  Goal Area(s) Addresses:  Patient will identify benefits of stress management. Patient will identify stress management techniques. Patient will identify benefits of using stress management post d/c.   Intervention: Stress Management  Activity :  Guided Imagery.  LRT introduced the stress management technique of guided imagery.  LRT read a script that took patients on a journey through a wildlife sanctuary.  Patients were to follow along as script was read to engage in activity.  Education:  Stress Management, Discharge Planning.   Education Outcome: Acknowledges Education  Clinical Observations/Feedback:  Pt did not attend group.    Hammond Obeirne, LRT/CTRS         Laiklynn Raczynski A 11/28/2018 10:52 AM 

## 2018-11-28 NOTE — Progress Notes (Signed)
Patient ID: [name] [name], male   DOB: August 19, 1971, 48 y.o.   MRN: [id number]   D: Pt alert and oriented on the unit.   A: Education, support, and encouragement provided. Discharge summary, medications and follow up appointments reviewed with pt. Suicide prevention resources provided, including "My 3 App." Pt's belongings in locker # 28 returned and belongings sheet signed.  R: Pt denies SI/HI, A/VH, pain, or any concerns at this time. Pt ambulatory on and off unit. Pt discharged to lobby.
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
## 2018-11-28 NOTE — Progress Notes (Signed)
  [facility] Adult Case Management Discharge Plan :  Will you be returning to the same living situation after discharge:  No. Patient reports he is discharging to his cousin's home.  At discharge, do you have transportation home?: Yes,  bus passes Do you have the ability to pay for your medications: No.  Release of information consent forms completed and in the chart;  Patient's signature needed at discharge.  Patient to Follow up at: Follow-up Information    [name]. Go on 11/28/2018.   Why:  Please attend your hospital discharge appointment 11/28/2018 at 8:00am. Contact information: [address] [place] [address] [phone]           Next level of care provider has access to [facility] Link:yes  Safety Planning and Suicide Prevention discussed: Yes,  with the patient  Have you used any form of tobacco in the last 30 days? (Cigarettes, Smokeless Tobacco, Cigars, and/or Pipes): No("I don't smoke")  Has patient been referred to the Quitline?: N/A patient is not a smoker  Patient has been referred for addiction treatment: N/A  Maeola Sarah, LCSWA 11/28/2018, 9:41 AM

## 2018-12-05 ENCOUNTER — Encounter (HOSPITAL_COMMUNITY): Payer: Self-pay

## 2018-12-05 ENCOUNTER — Emergency Department (HOSPITAL_COMMUNITY)
Admission: EM | Admit: 2018-12-05 | Discharge: 2018-12-07 | Disposition: A | Discharge status: Other Acute Inpatient Hospital | Payer: BLUE CROSS/BLUE SHIELD | Attending: Emergency Medicine | Admitting: Emergency Medicine

## 2018-12-05 DIAGNOSIS — F333 Major depressive disorder, recurrent, severe with psychotic symptoms: Secondary | ICD-10-CM | POA: Diagnosis present

## 2018-12-05 DIAGNOSIS — Z7901 Long term (current) use of anticoagulants: Secondary | ICD-10-CM | POA: Insufficient documentation

## 2018-12-05 DIAGNOSIS — F17200 Nicotine dependence, unspecified, uncomplicated: Secondary | ICD-10-CM | POA: Insufficient documentation

## 2018-12-05 DIAGNOSIS — Z8673 Personal history of transient ischemic attack (TIA), and cerebral infarction without residual deficits: Secondary | ICD-10-CM | POA: Insufficient documentation

## 2018-12-05 DIAGNOSIS — Z008 Encounter for other general examination: Secondary | ICD-10-CM | POA: Insufficient documentation

## 2018-12-05 DIAGNOSIS — R443 Hallucinations, unspecified: Secondary | ICD-10-CM

## 2018-12-05 DIAGNOSIS — I10 Essential (primary) hypertension: Secondary | ICD-10-CM | POA: Insufficient documentation

## 2018-12-05 DIAGNOSIS — Z91010 Allergy to peanuts: Secondary | ICD-10-CM | POA: Insufficient documentation

## 2018-12-05 LAB — COMPREHENSIVE METABOLIC PANEL
ALBUMIN: 4.1 g/dL (ref 3.5–5.0)
ALT: 31 U/L (ref 0–44)
AST: 20 U/L (ref 15–41)
Alkaline Phosphatase: 51 U/L (ref 38–126)
Anion gap: 12 (ref 5–15)
BUN: 12 mg/dL (ref 6–20)
CO2: 24 mmol/L (ref 22–32)
Calcium: 9.1 mg/dL (ref 8.9–10.3)
Chloride: 103 mmol/L (ref 98–111)
Creatinine, Ser: 1.13 mg/dL (ref 0.61–1.24)
GFR calc Af Amer: >60 mL/min (ref >60)
GFR calc non Af Amer: >60 mL/min (ref >60)
GLUCOSE: 77 mg/dL (ref 70–99)
POTASSIUM: 3.9 mmol/L (ref 3.5–5.1)
Sodium: 139 mmol/L (ref 135–145)
Total Bilirubin: 1.5 mg/dL — ABNORMAL HIGH (ref 0.3–1.2)
Total Protein: 7.3 g/dL (ref 6.5–8.1)

## 2018-12-05 LAB — CBC
HCT: 41.5 % (ref 39.0–52.0)
Hemoglobin: 13.8 g/dL (ref 13.0–17.0)
MCH: 33.3 pg (ref 26.0–34.0)
MCHC: 33.3 g/dL (ref 30.0–36.0)
MCV: 100 fL (ref 80.0–100.0)
Platelets: 267 10*3/uL (ref 150–400)
RBC: 4.15 MIL/uL — ABNORMAL LOW (ref 4.22–5.81)
RDW: 14.5 % (ref 11.5–15.5)
WBC: 7.1 10*3/uL (ref 4.0–10.5)
nRBC: 0 % (ref 0.0–0.2)

## 2018-12-05 LAB — RAPID URINE DRUG SCREEN, HOSP PERFORMED
Amphetamines: NOT DETECTED
Barbiturates: NOT DETECTED
Benzodiazepines: NOT DETECTED
Cocaine: NOT DETECTED
Opiates: NOT DETECTED
Tetrahydrocannabinol: NOT DETECTED

## 2018-12-05 LAB — ETHANOL: Alcohol, Ethyl (B): <10 mg/dL (ref <10)

## 2018-12-05 LAB — SALICYLATE LEVEL: Salicylate Lvl: <7 mg/dL (ref 2.8–30.0)

## 2018-12-05 LAB — ACETAMINOPHEN LEVEL: Acetaminophen (Tylenol), Serum: <10 ug/mL — ABNORMAL LOW (ref 10–30)

## 2018-12-05 LAB — CBG MONITORING, ED: Glucose-Capillary: 123 mg/dL — ABNORMAL HIGH (ref 70–99)

## 2018-12-05 MED ORDER — RIVAROXABAN 20 MG PO TABS
20.0000 mg | ORAL_TABLET | Freq: Every day | ORAL | Status: DC
  Administered 2018-12-06 – 2018-12-07 (×2): 20 mg via ORAL
  Filled 2018-12-05 (×2): qty 1

## 2018-12-05 MED ORDER — NITROGLYCERIN 0.4 MG SL SUBL: 0.4000 mg | SUBLINGUAL_TABLET | SUBLINGUAL | Status: DC | PRN

## 2018-12-05 MED ORDER — DULOXETINE HCL 60 MG PO CPEP
60.0000 mg | ORAL_CAPSULE | Freq: Every day | ORAL | Status: DC
  Administered 2018-12-06 – 2018-12-07 (×2): 60 mg via ORAL
  Filled 2018-12-05 (×2): qty 1

## 2018-12-05 MED ORDER — GABAPENTIN 300 MG PO CAPS
300.0000 mg | ORAL_CAPSULE | Freq: Two times a day (BID) | ORAL | Status: DC
  Administered 2018-12-06 – 2018-12-07 (×3): 300 mg via ORAL
  Filled 2018-12-05 (×3): qty 1

## 2018-12-05 MED ORDER — TRAZODONE HCL 50 MG PO TABS
150.0000 mg | ORAL_TABLET | Freq: Every day | ORAL | Status: DC
  Administered 2018-12-06 (×2): 150 mg via ORAL
  Filled 2018-12-05 (×2): qty 1

## 2018-12-05 MED ORDER — QUETIAPINE FUMARATE 50 MG PO TABS
50.0000 mg | ORAL_TABLET | Freq: Every day | ORAL | Status: DC
  Administered 2018-12-06 – 2018-12-07 (×2): 50 mg via ORAL
  Filled 2018-12-05 (×2): qty 1

## 2018-12-05 MED ORDER — HYDROXYZINE HCL 25 MG PO TABS
25.0000 mg | ORAL_TABLET | Freq: Three times a day (TID) | ORAL | Status: DC | PRN
  Administered 2018-12-06 – 2018-12-07 (×3): 25 mg via ORAL
  Filled 2018-12-05 (×3): qty 1

## 2018-12-05 MED ORDER — QUETIAPINE FUMARATE 200 MG PO TABS
400.0000 mg | ORAL_TABLET | Freq: Every day | ORAL | Status: DC
  Administered 2018-12-06 (×2): 400 mg via ORAL
  Filled 2018-12-05: qty 2
  Filled 2018-12-05: qty 1

## 2018-12-05 NOTE — ED Notes (Signed)
Pt repots that he was having suicidal thoughts of running into traffic but no longer feeling that way, denies HI, reports that he has been hearing things since being discharged from [facility] such as whispering sounds and seeing shadows.
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
## 2018-12-05 NOTE — ED Notes (Signed)
Pt reports he feels like his sugar is low. Unable to obtain CBG d/t technical difficulties. Pt given OJ and [place] sandwich.

## 2018-12-05 NOTE — ED Triage Notes (Signed)
Patient states that he is suicidal and has thoughts and plan to harm himself. [facility] and taking meds daily. Patient with flat affect. Labs collected and security notified

## 2018-12-06 ENCOUNTER — Other Ambulatory Visit: Payer: Self-pay

## 2018-12-06 ENCOUNTER — Encounter (HOSPITAL_COMMUNITY): Payer: Self-pay | Admitting: Registered Nurse

## 2018-12-06 NOTE — ED Notes (Signed)
Breakfast tray ordered 

## 2018-12-06 NOTE — ED Notes (Signed)
Pt voicing feelings of anxiety. Pt requesting Seroquel, pt informed that medication is scheduled for bedtime. Pt given PRN dose of vistaril.

## 2018-12-06 NOTE — Consult Note (Addendum)
  Tele Assessment   [name] [name], 48 y.o., male patient presented to [facility] with complaints of suicidal ideation.  Patient seen via telepsych by this provider; chart reviewed and consulted with Dr. Lucianne Muss on 12/06/18.  Patient discharged from [facility] 11/28/18 after 9 days of treatment.  On evaluation [name] [name] reports he has not followed up with any of the services after his discharge from [facility] 11/28/18.  States he was given a weeks worth of medication and he has ran out and has not gotten prescriptions filled.  Patient states that he started hearing the voices "I can't explain it; it's like a slurry.  I can't understand it." and also seeing "squiggly lines and shadows."  Patient also states that he is suicidal but unable to tell what the stressor is for his depression and suicidal ideation.  "It just comes out of the blue.  I can't control it."  Patient states that he doesn't really have a plan "but that easy.  Just walk into traffic; that's the easiest one."  Patient states that he lives with his cousin and a niece "that's another thing they get on my nerves and stress me out."  Patient states that he is unemployed and at current time his family is helping him support himself "I have 8 sisters and if I need anything they give it to me."  Patient denies illicit drug use and alcohol.  At this current time patient is unable to contract for safety stating "the suicidal thoughts are my main concern; I can't stop them and I can't say that I feel safe or won't act on them."  Patient denies homicidal ideation.    During evaluation [name] [name] is laying on his side in bed; he is alert/oriented x 4; calm/cooperative; and mood congruent with affect.  Patient is speaking in a clear tone at moderate volume, and normal pace; with good eye contact.  His thought process is coherent and relevant; There is no indication that he is currently responding to internal/external stimuli or experiencing delusional thought  content; but continues to endorse auditory/visual hallucinations.  Patient denies homicidal ideation and paranoia.  Patient has remained calm throughout assessment and has answered questions appropriately.     Recommendations:  Inpatient psychiatric treatment related to patient unable to contract for safety and states that he does not feel safe.    Disposition: Recommend psychiatric Inpatient admission when medically cleared.   Spoke with Jodi Geralds, NP; informed of above recommendation and disposition   Assunta Found, NP

## 2018-12-06 NOTE — ED Notes (Signed)
Belongings found in [place] on the floor. Inventoried and labeled. Placed in locker #3.

## 2018-12-06 NOTE — ED Provider Notes (Signed)
[name] [name] is a 48 y.o. male who presented yesterday for evaluation of hallucinations.  Psychiatry saw and evaluated patient and recommends inpatient treatment, awaiting placement.  Labs from yesterday reviewed, no acute abnormalities requiring intervention.  Vital stable overnight.  Patient with no acute events overnight, on my evaluation he is resting comfortably, continues to endorse hallucinations, no other acute complaints this morning.   Dartha Lodge, PA-C 12/06/18 [id number]    Jacalyn Lefevre, MD 12/06/18 [phone]

## 2018-12-06 NOTE — Progress Notes (Signed)
Pt. meets criteria for inpatient treatment per [facility] NP.  Referred out to the following hospitals: [facility]  [facility]  [facility]  [facility]  [facility]  [facility]  [facility]  [facility]  [facility]  [facility]     Disposition CSW will continue to follow for placement.  Timmothy Euler. Kaylyn Lim, MSW, LCSWA Disposition Clinical Social Work [phone] (cell) [phone] (office)

## 2018-12-06 NOTE — ED Notes (Signed)
Explained to pt that  The ER was the first stop when he had a psych crisis and  That then we look for a place for him that helps him meet his goals of feeling better and taking his meds

## 2018-12-07 ENCOUNTER — Other Ambulatory Visit: Payer: Self-pay

## 2018-12-07 ENCOUNTER — Inpatient Hospital Stay (HOSPITAL_COMMUNITY)
Admission: AD | Admit: 2018-12-07 | Discharge: 2018-12-12 | DRG: 885 | Disposition: A | Discharge status: Home / Self Care | Payer: Federal, State, Local not specified - Other | Source: Intra-hospital | Attending: Psychiatry | Admitting: Psychiatry

## 2018-12-07 ENCOUNTER — Encounter (HOSPITAL_COMMUNITY): Payer: Self-pay | Admitting: *Deleted

## 2018-12-07 DIAGNOSIS — R45851 Suicidal ideations: Secondary | ICD-10-CM | POA: Diagnosis present

## 2018-12-07 DIAGNOSIS — F410 Panic disorder [episodic paroxysmal anxiety] without agoraphobia: Secondary | ICD-10-CM | POA: Diagnosis present

## 2018-12-07 DIAGNOSIS — F17210 Nicotine dependence, cigarettes, uncomplicated: Secondary | ICD-10-CM | POA: Diagnosis present

## 2018-12-07 DIAGNOSIS — Z885 Allergy status to narcotic agent status: Secondary | ICD-10-CM

## 2018-12-07 DIAGNOSIS — F323 Major depressive disorder, single episode, severe with psychotic features: Secondary | ICD-10-CM | POA: Diagnosis present

## 2018-12-07 DIAGNOSIS — F333 Major depressive disorder, recurrent, severe with psychotic symptoms: Secondary | ICD-10-CM | POA: Diagnosis present

## 2018-12-07 DIAGNOSIS — G4730 Sleep apnea, unspecified: Secondary | ICD-10-CM | POA: Diagnosis present

## 2018-12-07 DIAGNOSIS — Z823 Family history of stroke: Secondary | ICD-10-CM

## 2018-12-07 DIAGNOSIS — Z915 Personal history of self-harm: Secondary | ICD-10-CM | POA: Diagnosis not present

## 2018-12-07 DIAGNOSIS — Z888 Allergy status to other drugs, medicaments and biological substances status: Secondary | ICD-10-CM

## 2018-12-07 DIAGNOSIS — F332 Major depressive disorder, recurrent severe without psychotic features: Secondary | ICD-10-CM | POA: Diagnosis not present

## 2018-12-07 DIAGNOSIS — Z880 Allergy status to penicillin: Secondary | ICD-10-CM

## 2018-12-07 DIAGNOSIS — Z91018 Allergy to other foods: Secondary | ICD-10-CM

## 2018-12-07 DIAGNOSIS — I82501 Chronic embolism and thrombosis of unspecified deep veins of right lower extremity: Secondary | ICD-10-CM | POA: Diagnosis present

## 2018-12-07 DIAGNOSIS — Z886 Allergy status to analgesic agent status: Secondary | ICD-10-CM | POA: Diagnosis not present

## 2018-12-07 DIAGNOSIS — I10 Essential (primary) hypertension: Secondary | ICD-10-CM | POA: Diagnosis present

## 2018-12-07 DIAGNOSIS — G4700 Insomnia, unspecified: Secondary | ICD-10-CM | POA: Diagnosis present

## 2018-12-07 DIAGNOSIS — Z23 Encounter for immunization: Secondary | ICD-10-CM | POA: Diagnosis not present

## 2018-12-07 DIAGNOSIS — Z7901 Long term (current) use of anticoagulants: Secondary | ICD-10-CM

## 2018-12-07 DIAGNOSIS — Z8673 Personal history of transient ischemic attack (TIA), and cerebral infarction without residual deficits: Secondary | ICD-10-CM | POA: Diagnosis not present

## 2018-12-07 DIAGNOSIS — Z590 Homelessness: Secondary | ICD-10-CM | POA: Diagnosis not present

## 2018-12-07 DIAGNOSIS — Z716 Tobacco abuse counseling: Secondary | ICD-10-CM

## 2018-12-07 DIAGNOSIS — Z86711 Personal history of pulmonary embolism: Secondary | ICD-10-CM

## 2018-12-07 DIAGNOSIS — Z79899 Other long term (current) drug therapy: Secondary | ICD-10-CM

## 2018-12-07 DIAGNOSIS — Z91010 Allergy to peanuts: Secondary | ICD-10-CM | POA: Diagnosis not present

## 2018-12-07 DIAGNOSIS — F419 Anxiety disorder, unspecified: Secondary | ICD-10-CM | POA: Diagnosis not present

## 2018-12-07 DIAGNOSIS — Z8249 Family history of ischemic heart disease and other diseases of the circulatory system: Secondary | ICD-10-CM | POA: Diagnosis not present

## 2018-12-07 MED ORDER — GABAPENTIN 300 MG PO CAPS
600.0000 mg | ORAL_CAPSULE | Freq: Two times a day (BID) | ORAL | Status: DC
  Administered 2018-12-07 – 2018-12-10 (×6): 600 mg via ORAL
  Filled 2018-12-07 (×9): qty 2

## 2018-12-07 MED ORDER — HYDROXYZINE HCL 25 MG PO TABS
25.0000 mg | ORAL_TABLET | Freq: Four times a day (QID) | ORAL | Status: DC | PRN
  Administered 2018-12-07: 25 mg via ORAL
  Filled 2018-12-07: qty 1

## 2018-12-07 MED ORDER — TRAZODONE HCL 50 MG PO TABS
50.0000 mg | ORAL_TABLET | Freq: Every evening | ORAL | Status: DC | PRN
  Administered 2018-12-07 – 2018-12-08 (×2): 50 mg via ORAL
  Filled 2018-12-07 (×2): qty 1

## 2018-12-07 MED ORDER — MAGNESIUM HYDROXIDE 400 MG/5ML PO SUSP: 30.0000 mL | Freq: Every day | ORAL | Status: DC | PRN

## 2018-12-07 MED ORDER — ALUM & MAG HYDROXIDE-SIMETH 200-200-20 MG/5ML PO SUSP
30.0000 mL | ORAL | Status: DC | PRN
  Administered 2018-12-08 – 2018-12-11 (×4): 30 mL via ORAL
  Filled 2018-12-07 (×4): qty 30

## 2018-12-07 MED ORDER — RIVAROXABAN 20 MG PO TABS
20.0000 mg | ORAL_TABLET | Freq: Every day | ORAL | Status: DC
  Administered 2018-12-07 – 2018-12-11 (×5): 20 mg via ORAL
  Filled 2018-12-07 (×3): qty 1
  Filled 2018-12-07: qty 14
  Filled 2018-12-07 (×3): qty 1

## 2018-12-07 MED ORDER — NITROGLYCERIN 0.4 MG SL SUBL: 0.4000 mg | SUBLINGUAL_TABLET | SUBLINGUAL | Status: DC | PRN

## 2018-12-07 MED ORDER — QUETIAPINE FUMARATE 400 MG PO TABS
400.0000 mg | ORAL_TABLET | Freq: Every day | ORAL | Status: DC
  Administered 2018-12-07 – 2018-12-08 (×2): 400 mg via ORAL
  Filled 2018-12-07 (×2): qty 1
  Filled 2018-12-07: qty 2
  Filled 2018-12-07 (×2): qty 1

## 2018-12-07 MED ORDER — QUETIAPINE FUMARATE 50 MG PO TABS
50.0000 mg | ORAL_TABLET | Freq: Every day | ORAL | Status: DC
  Administered 2018-12-07 – 2018-12-09 (×3): 50 mg via ORAL
  Filled 2018-12-07 (×6): qty 1

## 2018-12-07 MED ORDER — PNEUMOCOCCAL VAC POLYVALENT 25 MCG/0.5ML IJ INJ: 0.5000 mL | INJECTION | INTRAMUSCULAR | Status: DC

## 2018-12-07 MED ORDER — DULOXETINE HCL 60 MG PO CPEP
60.0000 mg | ORAL_CAPSULE | Freq: Every day | ORAL | Status: DC
  Administered 2018-12-07 – 2018-12-12 (×6): 60 mg via ORAL
  Filled 2018-12-07 (×10): qty 1
  Filled 2018-12-07: qty 14

## 2018-12-07 NOTE — Progress Notes (Signed)
Pt accepted to [facility], Bed 405-2 Reola Calkins, NP is the accepting provider.  Landry Mellow, MD is the attending provider.  Call report to [phone]  [facility] Psych ED notified.   Pt is Voluntary.  Pt may be transported by [place] [facility] scheduled  to arrive at [facility] as soon as transport can be arranged.  [name] [name]. Kaylyn Lim, MSW, LCSWA Disposition Clinical Social Work [phone] (cell) [phone] (office)

## 2018-12-07 NOTE — ED Notes (Signed)
Regular Diet was ordered for Lunch. 

## 2018-12-07 NOTE — ED Notes (Signed)
Breakfast ordered, diet reg- no sharps 

## 2018-12-07 NOTE — Progress Notes (Signed)
Patient is a 17 single yo AA male who is readmitted to [facility] after recently being discharged from here from a 9 day hospital stay, failing to keep his follow up appointments and to get his prescriptions filled ( he does admit that he took the week's worth of sample meds provided to him by [facility] at the time of his discharge. Marland Kitchen He comes to [facility] today from [facility] ED, where he presented there voluntarily today with  SI, he is homeless and has minimal support says he wants to die. At this time, he is   somewhat disorganized in his speech pattern, he shakes his head " no" to much of the admission questions and when he does attempt to answer, after a few words his speech becomes disorganized,  His voice gets more quiet and he does not make sense. 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 He is admamant that he does not take illegal drugs, that he is experiencing auditory hallucinations, but is not able to articulate, exactly what he hears and under what circumstances. HE does reinforce that he is homeless . Admission is completed , pt willing to contract with this wirter for safety and pt is oriented to the unit.

## 2018-12-07 NOTE — Progress Notes (Signed)
D: Pt passive SI/ AVH- contracts for safety . Pt is pleasant and cooperative. Pt stated he was having issues , but glad he was here to help get them straightened out.  A: Pt was offered support and encouragement. Pt was given scheduled medications. Pt was encourage to attend groups. Q 15 minute checks were done for safety.  R:Pt attends groups and interacts well with peers and staff. Pt is taking medication. Pt has no complaints.Pt receptive to treatment and safety maintained on unit.  Problem: Coping: Goal: Ability to interact with others will improve Outcome: Progressing   Problem: Activity: Goal: Interest or engagement in leisure activities will improve Outcome: Progressing

## 2018-12-07 NOTE — ED Provider Notes (Signed)
I have rounded on patient this morning.  He is sleeping soundly.  Labs and note have been reviewed.   Fayrene Helper, PA-C 12/07/18 [id number]    Derwood Kaplan, MD 12/08/18 [phone]

## 2018-12-07 NOTE — Tx Team (Signed)
Initial Treatment Plan 12/07/2018 5:40 PM [name] [name] [id number]    PATIENT STRESSORS: Financial difficulties Health problems Medication change or noncompliance   PATIENT STRENGTHS: Ability for insight Active sense of humor   PATIENT IDENTIFIED PROBLEMS: MDD  Suicidal Ideation  PE                 DISCHARGE CRITERIA:  Ability to meet basic life and health needs  PRELIMINARY DISCHARGE PLAN: Attend aftercare/continuing care group  PATIENT/FAMILY INVOLVEMENT: This treatment plan has been presented to and reviewed with the patient, [name] [name], and/or family membe .  The patient and family have been given the opportunity to ask questions and make suggestions.  Rich Brave, RN 12/07/2018, 5:40 PM

## 2018-12-07 NOTE — Progress Notes (Signed)
Adult Psychoeducational Group Note  Date:  12/07/2018 Time:  9:27 PM  Group Topic/Focus:  Wrap-Up Group:   The focus of this group is to help patients review their daily goal of treatment and discuss progress on daily workbooks.  Participation Level:  Active  Participation Quality:  Appropriate  Affect:  Appropriate  Cognitive:  Alert  Insight: Appropriate  Engagement in Group:  Engaged  Modes of Intervention:  Discussion  Additional Comments:  Pt stated that today has been difficult, but he is trying to make the best of it. He hopes to get his medications adjusted, so that he can start feeling better.   Kaleen OdeaCOOKE, Marcena Dias R 12/07/2018, 9:27 PM

## 2018-12-07 NOTE — ED Notes (Signed)
Pt still reports feelings of anxiety/remembering the nightmares he had about killing himself. Given book to read and TV channel turned for distraction, will asking for additional PRNs if this is not effective.

## 2018-12-08 DIAGNOSIS — F332 Major depressive disorder, recurrent severe without psychotic features: Secondary | ICD-10-CM

## 2018-12-08 DIAGNOSIS — F419 Anxiety disorder, unspecified: Secondary | ICD-10-CM

## 2018-12-08 DIAGNOSIS — R45851 Suicidal ideations: Secondary | ICD-10-CM

## 2018-12-08 DIAGNOSIS — G4700 Insomnia, unspecified: Secondary | ICD-10-CM

## 2018-12-08 MED ORDER — HYDROXYZINE HCL 50 MG PO TABS
50.0000 mg | ORAL_TABLET | Freq: Three times a day (TID) | ORAL | Status: DC | PRN
  Administered 2018-12-08 – 2018-12-09 (×3): 50 mg via ORAL
  Filled 2018-12-08 (×3): qty 1

## 2018-12-08 NOTE — Progress Notes (Signed)
D: Pt denies SI/HI/AVH. Pt is pleasant and cooperative. Pt stated he was doing about the same, pt visible on the unit this evening.  A: Pt was offered support and encouragement. Pt was given scheduled medications. Pt was encourage to attend groups. Q 15 minute checks were done for safety.  R:Pt attends groups and interacts well with peers and staff. Pt is taking medication. Pt has no complaints.Pt receptive to treatment and safety maintained on unit.  Problem: Coping: Goal: Ability to interact with others will improve Outcome: Progressing   Problem: Self-Concept: Goal: Will verbalize positive feelings about self Outcome: Progressing    Problem: Education: Goal: Utilization of techniques to improve thought processes will improve Outcome: Progressing
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
## 2018-12-08 NOTE — [facility] Group Notes (Signed)
Date:  12/08/2018 Time:  1:24 PM  Group Topic/Focus:  Identifying Needs:   The focus of this group is to help patients identify their personal needs that have been historically problematic and identify healthy behaviors to address their needs.  Participation Level:  Active  Participation Quality:  Appropriate  Affect:  Appropriate  Cognitive:  Appropriate  Insight: Appropriate  Engagement in Group:  Engaged  Modes of Intervention:  Activity, Discussion, Education, Exploration, Rapport Building, Socialization and Support  Additional Comments:  Pt attended and participated during goals group.  Shandy Checo C 12/08/2018, 1:24 PM  

## 2018-12-08 NOTE — [facility] Group Notes (Signed)
[facility] Group Notes: (Clinical Social Work)   12/08/2018      Type of Therapy:  Group Therapy   Participation Level:  Did Not Attend despite MHT prompting   Ambrose Mantle, LCSW 12/08/2018, 11:34 AM

## 2018-12-08 NOTE — [facility] Counselor (Signed)
Adult Comprehensive Assessment  Patient ID: [name] [name], male   DOB: August 10, 1971, 48 y.o.   MRN: [id number]  Information Source: Information source: Patient  Current Stressors:  Patient states their primary concerns and needs for treatment are:: depression, SI with attempt to walk into traffic-"I'm suprised noone hit me." AVH Patient states their goals for this hospitilization and ongoing recovery are:: "To get my medications adjusted and be able to cope with life."  Educational / Learning stressors: N/A  Employment / Job issues: Unemployed; Patient reports he can easily find a part time job, however he does not have one at this time.  Family Relationships: Patient denies any stressors Financial / Lack of resources (include bankruptcy): No income and no health insurance  Housing / Lack of housing: Patient reports living in an extended stay motel for the past week. Prior to this, he was staying with a cousin. "my family makes fun of my mental illness so I decided to leave."  Physical health (include injuries &life threatening diseases): Patient reports having DVT's  Social relationships: Patient denies any stressors  Substance abuse: Patient denies any stressors  Bereavement / Loss: Patient denies any stressors  Living/Environment/Situation: Living Arrangements: extended stay motel Living conditions (as described by patient or guardian): temporary. Difficult to afford  Who else lives in the home?: alone How long has patient lived in current situation?: one week What is atmosphere in current home: temporary   Family History: Marital status: Single Are you sexually active?: Yes What is your sexual orientation?: Heterosexual  Has your sexual activity been affected by drugs, alcohol, medication, or emotional stress?: No  Does patient have children?: Yes How many children?: 2 How is patient's relationship with their children?: Patient reports having a good relationship with his 48  year old twins.  Childhood History: By whom was/is the patient raised?: Both parents Description of patient's relationship with caregiver when they were a child: Patient reports having a good relationship with both of his parents during his childhood.  Patient's description of current relationship with people who raised him/her: Patient reports both of his parents are currently deceased  How were you disciplined when you got in trouble as a child/adolescent?: "I had to do more chores"  Does patient have siblings?: Yes Number of Siblings: 11 Description of patient's current relationship with siblings: Patient reports having a good relationshop with his 8 sisters and 3 brothers.  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?: No Are There Guns or Other Weapons in Your Home?: No  Financial Resources: Financial resources: No income Does patient have a Lawyer or guardian?: No  Alcohol/Substance Abuse: What has been your use of drugs/alcohol within  the last 12 months?: Patient  denies  If attempted suicide, did drugs/alcohol play a role in this?: No Alcohol/Substance Abuse Treatment Hx: Denies past history Has alcohol/substance abuse ever caused legal problems?: No  Social Support System: Conservation officer, nature Support System: Fair Development worker, community Support System: "My sister" Type of faith/religion: Christianity  How does patient's faith help to cope with current illness?: Prayer  Leisure/Recreation: Leisure and Hobbies: "None"  Strengths/Needs: What is the patient's perception of their strengths?: "Not right now, I don't know" Patient states they can use these personal strengths during their treatment to contribute to their recovery: To be determined  Patient states these barriers may affect/interfere with their treatment: No  Patient states these barriers may affect their return to the community: No  Discharge Plan: Currently receiving community mental health services: Yes-[name] and wellness.  Patient states concerns and preferences for aftercare planning are: I want to get into [facility] for group home placement. I can [facility] I [facility]." CSW assessing, as pt does not have a payer source but states he has a disability hearing in February.  Patient states they will know when they are safe and ready for discharge when: To be determined  Does patient have access to transportation?: Yes-bus Does patient have financial barriers related to discharge medications?: Yes Patient description of barriers related to discharge medications: No income and no health insurance  Plan for living situation after discharge: unsure. Possibly back to extended stay hotel or with family member.  Will patient be returning to same living situation after discharge?: unsure at this time. CSW continuing to assess.          Summary/Recommendations:   Summary and Recommendations (to be completed by the evaluator): Pt is 48yo male who  identifies as homeless in [place], [place] He presents to the hospital seeking treatment for SI with attempt/plan, ongoing depression, AVH, and for medication stabilization. Pt denies HI but continues to endorse AVH and passive SI thoughts. Pt was recently [facility] and reports he immediately felt suicidal. Pt has a primary diagnosis of MDD, recurrent, severe, with psychosis. Pt denies Substance abuse. Pt is unemployed, single, with twin 77 yo children, and has been staying in an extended stay hotel. Pt reports limited family and social supports. Recommendations for pt include: crisis stabilization, therapeutic milieu, encourage group attendance and participation, medication management for mood stabilization, and development of comprehensive mental wellness plan. CSW assessing for appropriate referrals. Pt is open to [facility] [employer] [facility] and Wellness at discharge. He is unsure of living situation at discharge--CSW assessing.   Rona Ravens LCSW 12/08/2018 11:28 AM

## 2018-12-08 NOTE — BHH Suicide Risk Assessment (Signed)
[facility] Admission Suicide Risk Assessment   Nursing information obtained from:  Patient Demographic factors:  Male Current Mental Status:  Suicidal ideation indicated by patient Loss Factors:  Decrease in vocational status, Decline in physical health Historical Factors:  Impulsivity Risk Reduction Factors:  Positive social support, Positive therapeutic relationship, Positive coping skills or problem solving skills  Total Time spent with patient: 20 minutes Principal Problem: <principal problem not specified> Diagnosis:  Active Problems:   Major depression with psychotic features ([name]  Subjective Data: Patient is seen and examined.  Patient is a 48 year old male who presented to the [facility] emergency department on 1/16 expressing suicidal ideation.  The patient had been recently hospitalized at the [facility] in December 2019.  He was hospitalized here until 11/28/2018.  He had been hospitalized for 9 days at that time.  He was given a 7-day supply of medication, but ran out of his medications.  He was unable to get any medications refilled because of a lack of funds.  The patient stated he began to start hearing voices again, seeing visual hallucinations, and becoming suicidal.  He stated that he was unemployed, and basically lives with his cousin and a niece.  He stated he was unable to stay there because "they get all my nerves".  He stated several times today that "no one is listening to me".  He is unhappy because the fact that I restarted the medications he was discharged on.  I tried to explain to him that they had started him on several medicines on the last hospitalization, and that these had been working prior to discharge.  That some these medications took time to get into his system, and the fact that he did not continue them was the reason why they were not working.  He is calm, but unhappy with his circumstances.  He stated that the last 2  psychiatric hospitalizations he had he took the medications for 7 days, they ran out, and he had no funds to be able to afford to get any additional medication.  He has a reported history of bipolar disorder.  On his last hospitalization on 11/19/2018 he was admitted through the [facility] emergency department.  His laboratories on admission were essentially normal.  He was admitted to the hospital for evaluation and stabilization.  Continued Clinical Symptoms:  Alcohol Use Disorder Identification Test Final Score (AUDIT): 1 The "Alcohol Use Disorders Identification Test", Guidelines for Use in Primary Care, Second Edition.  World Science writer [facility]). Score between 0-7:  no or low risk or alcohol related problems. Score between 8-15:  moderate risk of alcohol related problems. Score between 16-19:  high risk of alcohol related problems. Score 20 or above:  warrants further diagnostic evaluation for alcohol dependence and treatment.   CLINICAL FACTORS:   Bipolar Disorder:   Depressive phase   Musculoskeletal: Strength & Muscle Tone: within normal limits Gait & Station: normal Patient leans: N/A  Psychiatric Specialty Exam: Physical Exam  Nursing note and vitals reviewed. Constitutional: He is oriented to person, place, and time. He appears well-developed and well-nourished.  HENT:  Head: Normocephalic and atraumatic.  Respiratory: Effort normal.  Neurological: He is alert and oriented to person, place, and time.    ROS  Blood pressure 113/62, pulse 72, temperature 97.8 F (36.6 C), resp. rate 17, height 6' (1.829 m), weight 95.7 kg, SpO2 99 %.Body mass index is 28.62 kg/m.  General Appearance: Casual  Eye Contact:  Poor  Speech:  Normal Rate  Volume:  Decreased  Mood:  Anxious, Depressed and Dysphoric  Affect:  Congruent  Thought Process:  Coherent, Goal Directed and Descriptions of Associations: Circumstantial  Orientation:  Full (Time, Place, and Person)   Thought Content:  Hallucinations: Auditory Visual  Suicidal Thoughts:  Yes.  without intent/plan  Homicidal Thoughts:  No  Memory:  Immediate;   Fair Recent;   Fair Remote;   Fair  Judgement:  Impaired  Insight:  Lacking  Psychomotor Activity:  Normal  Concentration:  Concentration: Fair and Attention Span: Fair  Recall:  [employer] of Knowledge:  Fair  Language:  Fair  Akathisia:  Negative  Handed:  Right  AIMS (if indicated):     Assets:  Desire for Improvement Physical Health  ADL's:  Intact  Cognition:  WNL  Sleep:  Number of Hours: 5.25      COGNITIVE FEATURES THAT CONTRIBUTE TO RISK:  Thought constriction (tunnel vision)    SUICIDE RISK:   Mild:  Suicidal ideation of limited frequency, intensity, duration, and specificity.  There are no identifiable plans, no associated intent, mild dysphoria and related symptoms, good self-control (both objective and subjective assessment), few other risk factors, and identifiable protective factors, including available and accessible social support.  PLAN OF CARE: Patient is seen and examined.  Patient is a 48 year old male with a past psychiatric history significant for bipolar disorder with psychotic features.  He is admitted to the hospital secondary to suicidal ideation and auditory visual hallucinations.  He will be admitted to the hospital.  He will be integrated into the milieu.  He will be restarted on the Seroquel as well as duloxetine.  He will be continued on his Xarelto for now.  Social work will have to be significantly involved at this point.  He has had 2-3 psychiatric hospitalizations, takes the samples that are provided for him for 7 days, but then given financial and social stressors is unable to have those prescriptions filled.  Hopefully we will be able to find something within the system so he can get more consistent medical and psychiatric care.  I certify that inpatient services furnished can reasonably be expected  to improve the patient's condition.   Antonieta Pert, MD 12/08/2018, 8:32 AM

## 2018-12-08 NOTE — Plan of Care (Signed)
Progress note  D: pt found in the dayroom interacting; compliant with medication administration. Pt sates he slept fair. Pt rates his depression/hopelessness/anxiety a 9/10/9 out of 10 respectively. Pt has complaints of lightheadedness, dizziness, a rash, and right leg pain that he feels may be a DVT. Pt states his goal for today is to work on his anxiety and depression levels and he will achieve this by attending meetings and groups. Pt denies si/hi but does endorse ah/vh. Pt states these are getting better and agrees to approach staff before harming himself or others while at [facility].  A: pt provided support and encouragement. Pt given medication per protocol and standing orders. Q51m safety checks implemented and continued.  R: pt safe on the unit. Will continue to monitor.   Pt progressing in the following metrics  Problem: Activity: Goal: Will identify at least one activity in which they can participate Outcome: Progressing   Problem: Coping: Goal: Ability to identify and develop effective coping behavior will improve Outcome: Progressing Goal: Demonstration of participation in decision-making regarding own care will improve Outcome: Progressing Goal: Ability to use eye contact when communicating with others will improve Outcome: Progressing

## 2018-12-08 NOTE — BHH Group Notes (Signed)
BHH Group Notes:  (Nursing)  Date:  12/08/2018  Time: 8:30 am Type of Therapy:  Orientation  Participation Level:  Active  Participation Quality:  Appropriate and Attentive  Affect:  Appropriate  Cognitive:  Alert and Appropriate  Insight:  Appropriate  Engagement in Group:  Engaged  Modes of Intervention:  Orientation  Summary of Progress/Problems:  [name] [name] 12/08/2018, 9:32 AM

## 2018-12-08 NOTE — Progress Notes (Signed)
Patient rated his day as a 3 out of 10. He did not go into greater detail about his day. His goal for tomorrow is to work on his anxiety and mood.

## 2018-12-08 NOTE — BHH Suicide Risk Assessment (Signed)
B[facility] INPATIENT:  Family/Significant Other Suicide Prevention Education  Suicide Prevention Education:  Patient Refusal for Family/Significant Other Suicide Prevention Education: The patient [name] [name] has refused to provide written consent for family/significant other to be provided Family/Significant Other Suicide Prevention Education during admission and/or prior to discharge.  Physician notified.  SPE completed with pt, as pt refused to consent to family contact. SPI pamphlet provided to pt and pt was encouraged to share information with support network, ask questions, and talk about any concerns relating to SPE. Pt denies access to guns/firearms and verbalized understanding of information provided. Mobile Crisis information also provided to pt.   Rona Ravens LCSW 12/08/2018, 11:31 AM

## 2018-12-09 MED ORDER — QUETIAPINE FUMARATE 100 MG PO TABS
100.0000 mg | ORAL_TABLET | Freq: Every day | ORAL | Status: DC
  Administered 2018-12-10: 100 mg via ORAL
  Filled 2018-12-09 (×3): qty 1

## 2018-12-09 MED ORDER — TRAZODONE HCL 100 MG PO TABS
100.0000 mg | ORAL_TABLET | Freq: Every evening | ORAL | Status: DC | PRN
  Administered 2018-12-09: 100 mg via ORAL
  Filled 2018-12-09: qty 1

## 2018-12-09 MED ORDER — QUETIAPINE FUMARATE 300 MG PO TABS
600.0000 mg | ORAL_TABLET | Freq: Every day | ORAL | Status: DC
  Administered 2018-12-09: 600 mg via ORAL
  Filled 2018-12-09 (×3): qty 2

## 2018-12-09 NOTE — Progress Notes (Signed)
D Pt is brighter, more organized, making better eye contact and looks more comfortable today.     A HE admits to this writer that he is " feeling better". HE completed his daily assessment and on this he wrote he has experienced SI today, but he contracts with this writer to stay safe. HE rated his depression, hopelessness and anxiiety " 8/8/6"< respectively.     R Safety is in place.

## 2018-12-09 NOTE — B[facility] Group Notes (Signed)
B[facility] Group Notes:  (Nursing/MHT/Case Management/Adjunct)  Date:  12/09/2018  Time:  9:00 am  Type of Therapy:  Nurse Education  Participation Level:  Active  Participation Quality:  Appropriate  Affect:  Appropriate  Cognitive:  Appropriate  Insight:  Appropriate  Engagement in Group:  Engaged  Modes of Intervention:  Education  Summary of Progress/Problems:  Patient was alert, oriented and participated in group this morning.    [name] [name] 12/09/2018, 9:23 AM

## 2018-12-09 NOTE — Plan of Care (Signed)
  Problem: Coping: Goal: Ability to identify and develop effective coping behavior will improve Outcome: Progressing   
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
## 2018-12-09 NOTE — Progress Notes (Signed)
Adult Psychoeducational Group Note  Date:  12/09/2018 Time:  9:17 PM  Group Topic/Focus:  Wrap-Up Group:   The focus of this group is to help patients review their daily goal of treatment and discuss progress on daily workbooks.  Participation Level:  Active  Participation Quality:  Appropriate  Affect:  Appropriate  Cognitive:  Appropriate  Insight: Appropriate  Engagement in Group:  Engaged  Modes of Intervention:  Discussion  Additional Comments:  Patient attended wrap-up group and participated.   Sulay Brymer W Ailany Koren 12/09/2018, 9:17 PM

## 2018-12-09 NOTE — BHH Group Notes (Signed)
[facility] LCSW Group Therapy Note  Date/Time:    12/09/2018 10:00-11:00AM  Type of Therapy and Topic:  Group Therapy:  Practicing Self-Kindness  Participation Level:  Active   Description of Group:  The focus of this group is to examine human tendencies to be hyper critical of self and how this leads to feelings of worthlessness, hopelessness, and shame.  Patients were guided to the concept that shame is universal and worsened by being kept hidden but improved by being revealed.  We discussed how not feeling worthy is the result of shame and discussed the differences between guilt and shame.  Part of a song "Worth It" was played to encourage people to think differently about worth.  It was shared why it is important to build the ability to tolerate discomfort, since numbing emotions unfortunately applies to both painful and positive feelings and is not selective.  Gratitude was linked to joy and a variety of coping methods were suggested.  Multiple exercises were led to experience a shortened version of several of the skills described.  A song was played at the end of group entitled "I am enough."  Therapeutic Goals 1. Identify statements patients automatically say to themselves, "I'll be worthy when...." and examine how this is not kind to self because it indicates we cannot be worthy until some far-reaching goal is achieved. 2. Share current healthy and unhealthy coping skills used. 3. Practice multiple coping skills including a. Questioning whether they feel guilty ("I did something bad") or shame ("I am bad") and whether this feeling is based in fact. b. 5 senses mindfulness c. Camera - Zoom in and Out d. Grounding  e. AEIOUY (Abstinence, Exercise, I, Others, Unexpressed, Yeah) f. TGIF (Trust, Gratitude, Inspiration, [place] [address]) 4. Learn about research linking gratitude to joy, and the difference between happiness and joy. 5. Encourage to do the needed work to Nurse, mental health, focusing on how  medication is part of the solution to emotional and mental problems, while coping skills are necessary to actually change behavior.  Summary of Patient Progress: During group, patient expressed that a current healthy coping skill used is martial arts and reading the encyclopedia, while a current unhealthy coping skill used was not shared.  Patient participated in the exercises but did not engage in any discussion.   Therapeutic Modalities Activity Processing Lecture   Ambrose Mantle, LCSW 12/09/2018, 1:59 PM

## 2018-12-10 DIAGNOSIS — F323 Major depressive disorder, single episode, severe with psychotic features: Secondary | ICD-10-CM

## 2018-12-10 MED ORDER — HYDROXYZINE HCL 25 MG PO TABS
25.0000 mg | ORAL_TABLET | Freq: Three times a day (TID) | ORAL | Status: DC | PRN
  Administered 2018-12-10 – 2018-12-11 (×4): 25 mg via ORAL
  Filled 2018-12-10 (×2): qty 1
  Filled 2018-12-10: qty 20
  Filled 2018-12-10: qty 4

## 2018-12-10 MED ORDER — GABAPENTIN 400 MG PO CAPS
400.0000 mg | ORAL_CAPSULE | Freq: Two times a day (BID) | ORAL | Status: DC
  Administered 2018-12-10 – 2018-12-12 (×4): 400 mg via ORAL
  Filled 2018-12-10: qty 1
  Filled 2018-12-10: qty 28
  Filled 2018-12-10 (×4): qty 1
  Filled 2018-12-10: qty 28
  Filled 2018-12-10 (×2): qty 1

## 2018-12-10 MED ORDER — TRAZODONE HCL 50 MG PO TABS
50.0000 mg | ORAL_TABLET | Freq: Every evening | ORAL | Status: DC | PRN
  Administered 2018-12-10 – 2018-12-11 (×2): 50 mg via ORAL
  Filled 2018-12-10: qty 7
  Filled 2018-12-10: qty 5

## 2018-12-10 MED ORDER — QUETIAPINE FUMARATE 400 MG PO TABS
500.0000 mg | ORAL_TABLET | Freq: Every day | ORAL | Status: DC
  Administered 2018-12-10 – 2018-12-11 (×2): 500 mg via ORAL
  Filled 2018-12-10 (×4): qty 1

## 2018-12-10 NOTE — Progress Notes (Signed)
Adult Psychoeducational Group Note  Date:  12/10/2018 Time:  9:42 PM  Group Topic/Focus:  Wrap-Up Group:   The focus of this group is to help patients review their daily goal of treatment and discuss progress on daily workbooks.  Participation Level:  Active  Participation Quality:  Appropriate  Affect:  Appropriate  Cognitive:  Appropriate  Insight: Appropriate  Engagement in Group:  Engaged  Modes of Intervention:  Discussion  Additional Comments:  Patient attended group and said that his day was a 6.5.  Patient described his day as being very content.   [name] [name] W Maryalyce Sanjuan 12/10/2018, 9:42 PM
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
## 2018-12-10 NOTE — BHH Group Notes (Signed)
LCSW Group Therapy Note 12/10/2018 3:05 PM  Type of Therapy and Topic: Group Therapy: Overcoming Obstacles  Participation Level: Active  Description of Group:  In this group patients will be encouraged to explore what they see as obstacles to their own wellness and recovery. They will be guided to discuss their thoughts, feelings, and behaviors related to these obstacles. The group will process together ways to cope with barriers, with attention given to specific choices patients can make. Each patient will be challenged to identify changes they are motivated to make in order to overcome their obstacles. This group will be process-oriented, with patients participating in exploration of their own experiences as well as giving and receiving support and challenge from other group members.  Therapeutic Goals: 1. Patient will identify personal and current obstacles as they relate to admission. 2. Patient will identify barriers that currently interfere with their wellness or overcoming obstacles.  3. Patient will identify feelings, thought process and behaviors related to these barriers. 4. Patient will identify two changes they are willing to make to overcome these obstacles:   Summary of Patient Progress  [name] [name] was engaged and participated throughout the group session. [name] [name] reports that his main obstacle is the "feeling of displacement". [name] [name] reports that he struggles with find a new "life plan" and that he has issues with finding stability.     Therapeutic Modalities:  Cognitive Behavioral Therapy Solution Focused Therapy Motivational Interviewing Relapse Prevention Therapy   Alcario Drought Clinical Social Worker

## 2018-12-10 NOTE — Progress Notes (Signed)
Patient has been given fluids throughout the afternoon and after dinner encouraging him to drink.  Patient stated he is feeling better.  Respirations even and unlabored.  No signs/symptoms of pain/distress noted on patient's face/body movements.  Safety maintained with 15 minute checks.

## 2018-12-10 NOTE — Tx Team (Signed)
Interdisciplinary Treatment and Diagnostic Plan Update  12/10/2018 Time of Session: 9:00am [place] TETTEH MRN: [id number]  Principal Diagnosis: <principal problem not specified>  Secondary Diagnoses: Active Problems:   Major depression with psychotic features ([name]   Current Medications:  Current Facility-Administered Medications  Medication Dose Route Frequency Provider Last Rate Last Dose  . alum & mag hydroxide-simeth (MAALOX/MYLANTA) 200-200-20 MG/5ML suspension 30 mL  30 mL Oral Q4H PRN Antonieta Pert, MD   30 mL at 12/10/18 [phone]  . DULoxetine (CYMBALTA) DR capsule 60 mg  60 mg Oral Daily Antonieta Pert, MD   60 mg at 12/10/18 0744  . gabapentin (NEURONTIN) capsule 600 mg  600 mg Oral BID Antonieta Pert, MD   600 mg at 12/10/18 0745  . hydrOXYzine (ATARAX/VISTARIL) tablet 50 mg  50 mg Oral TID PRN Antonieta Pert, MD   50 mg at 12/09/18 2131  . magnesium hydroxide (MILK OF MAGNESIA) suspension 30 mL  30 mL Oral Daily PRN Antonieta Pert, MD      . nitroGLYCERIN (NITROSTAT) SL tablet 0.4 mg  0.4 mg Sublingual Q5 min PRN Antonieta Pert, MD      . pneumococcal 23 valent vaccine (PNU-IMMUNE) injection 0.5 mL  0.5 mL Intramuscular Tomorrow-1000 Antonieta Pert, MD      . QUEtiapine (SEROQUEL) tablet 100 mg  100 mg Oral Daily Aldean Baker, NP   100 mg at 12/10/18 0744  . QUEtiapine (SEROQUEL) tablet 600 mg  600 mg Oral QHS Aldean Baker, NP   600 mg at 12/09/18 2129  . rivaroxaban (XARELTO) tablet 20 mg  20 mg Oral Q supper Antonieta Pert, MD   20 mg at 12/09/18 1656  . traZODone (DESYREL) tablet 100 mg  100 mg Oral QHS PRN Antonieta Pert, MD   100 mg at 12/09/18 2131   PTA Medications: Medications Prior to Admission  Medication Sig Dispense Refill Last Dose  . DULoxetine (CYMBALTA) 60 MG capsule Take 1 capsule (60 mg total) by mouth daily. For mood 30 capsule 0 12/06/2018 at Unknown time  . gabapentin (NEURONTIN) 300 MG capsule Take 1 capsule (300 mg  total) by mouth 2 (two) times daily. For anxiety/agitation 60 capsule 0 12/06/2018 at Unknown time  . hydrOXYzine (ATARAX/VISTARIL) 25 MG tablet Take 1 tablet (25 mg total) by mouth 3 (three) times daily as needed for anxiety. 60 tablet 0 12/06/2018 at Unknown time  . QUEtiapine (SEROQUEL) 400 MG tablet Take 1 tablet (400 mg total) by mouth at bedtime. For mood/hallucinations/sleep 30 tablet 0 12/06/2018 at Unknown time  . QUEtiapine (SEROQUEL) 50 MG tablet Take 1 tablet (50 mg total) by mouth every morning. For mood/hallucinations 30 tablet 0 12/06/2018 at Unknown time  . rivaroxaban (XARELTO) 20 MG TABS tablet Take 1 tablet (20 mg total) by mouth daily with supper. For DVT 30 tablet 0 12/06/2018 at Unknown time  . nitroGLYCERIN (NITROSTAT) 0.4 MG SL tablet Place 0.4 mg under the tongue every 5 (five) minutes as needed for chest pain.   More than a month at Unknown time  . traZODone (DESYREL) 150 MG tablet Take 1 tablet (150 mg total) by mouth at bedtime. For sleep 30 tablet 0 Unknown at Unknown time    Patient Stressors: Financial difficulties Health problems Medication change or noncompliance  Patient Strengths: Ability for insight Active sense of humor  Treatment Modalities: Medication Management, Group therapy, Case management,  1 to 1 session with clinician, Psychoeducation, Recreational therapy.  Physician Treatment Plan for Primary Diagnosis: <principal problem not specified> Long Term Goal(s): Improvement in symptoms so as ready for discharge Improvement in symptoms so as ready for discharge   Short Term Goals: Ability to identify changes in lifestyle to reduce recurrence of condition will improve Ability to verbalize feelings will improve Ability to disclose and discuss suicidal ideas Ability to demonstrate self-control will improve Ability to identify and develop effective coping behaviors will improve Ability to identify triggers associated with substance abuse/mental health  issues will improve  Medication Management: Evaluate patient's response, side effects, and tolerance of medication regimen.  Therapeutic Interventions: 1 to 1 sessions, Unit Group sessions and Medication administration.  Evaluation of Outcomes: Progressing  Physician Treatment Plan for Secondary Diagnosis: Active Problems:   Major depression with psychotic features ([name]  Long Term Goal(s): Improvement in symptoms so as ready for discharge Improvement in symptoms so as ready for discharge   Short Term Goals: Ability to identify changes in lifestyle to reduce recurrence of condition will improve Ability to verbalize feelings will improve Ability to disclose and discuss suicidal ideas Ability to demonstrate self-control will improve Ability to identify and develop effective coping behaviors will improve Ability to identify triggers associated with substance abuse/mental health issues will improve     Medication Management: Evaluate patient's response, side effects, and tolerance of medication regimen.  Therapeutic Interventions: 1 to 1 sessions, Unit Group sessions and Medication administration.  Evaluation of Outcomes: Progressing   RN Treatment Plan for Primary Diagnosis: <principal problem not specified> Long Term Goal(s): Knowledge of disease and therapeutic regimen to maintain health will improve  Short Term Goals: Ability to participate in decision making will improve, Ability to verbalize feelings will improve and Ability to identify and develop effective coping behaviors will improve  Medication Management: RN will administer medications as ordered by provider, will assess and evaluate patient's response and provide education to patient for prescribed medication. RN will report any adverse and/or side effects to prescribing provider.  Therapeutic Interventions: 1 on 1 counseling sessions, Psychoeducation, Medication administration, Evaluate responses to treatment, Monitor  vital signs and CBGs as ordered, Perform/monitor CIWA, COWS, AIMS and Fall Risk screenings as ordered, Perform wound care treatments as ordered.  Evaluation of Outcomes: Progressing   LCSW Treatment Plan for Primary Diagnosis: <principal problem not specified> Long Term Goal(s): Safe transition to appropriate next level of care at discharge, Engage patient in therapeutic group addressing interpersonal concerns.  Short Term Goals: Engage patient in aftercare planning with referrals and resources, Increase social support, Increase emotional regulation, Identify triggers associated with mental health/substance abuse issues and Increase skills for wellness and recovery  Therapeutic Interventions: Assess for all discharge needs, 1 to 1 time with Social worker, Explore available resources and support systems, Assess for adequacy in community support network, Educate family and significant other(s) on suicide prevention, Complete Psychosocial Assessment, Interpersonal group therapy.  Evaluation of Outcomes: Progressing   Progress in Treatment: Attending groups: Yes. Participating in groups: Yes. Taking medication as prescribed: Yes. Toleration medication: Yes. Family/Significant other contact made: No, will contact:  supports if consent is granted Patient understands diagnosis: Yes. Discussing patient identified problems/goals with staff: Yes. Medical problems stabilized or resolved: Yes. Denies suicidal/homicidal ideation: No. Issues/concerns per patient self-inventory: Yes.  New problem(s) identified: Yes, Describe:  patient is homeless, may require shelter resources and referrals.   New Short Term/Long Term Goal(s):  medication management for mood stabilization; elimination of SI thoughts; development of comprehensive mental wellness/sobriety plan.  Patient Goals:  "  Get better, adjust medications."  Discharge Plan or Barriers: CSW continuing to assess. MHAG pamphlet, Mobile Crisis  information, and AA/NA information provided to patient for additional community support and resources.   Reason for Continuation of Hospitalization: Anxiety Depression Suicidal ideation  Estimated Length of Stay: 3-5 days  Attendees: Patient: [name] [name] 12/10/2018 10:38 AM  Physician: Javier [email or web address] 12/10/2018 10:38 AM  Nursing:  12/10/2018 10:38 AM  RN Care Manager: 12/10/2018 10:38 AM  Social Worker: Enid Cutterharlotte Raeann Offner, [place] 12/10/2018 10:38 AM  Recreational Therapist:  12/10/2018 10:38 AM  Other:  12/10/2018 10:38 AM  Other:  12/10/2018 10:38 AM  Other: 12/10/2018 10:38 AM    Scribe for Treatment Team: Darreld Mcleanharlotte C Lennix Rotundo, LCSWA 12/10/2018 10:38 AM

## 2018-12-10 NOTE — Progress Notes (Addendum)
D:  Patient's self inventory sheet, patient has poor sleep, sleep medication not helping.  Poor appetite, low energy level, poor concentration.  Rated depression and anxiety 10, hopeless 9.  Denied withdrawals.  SI, no plan, contracts for safety.  Physical problems, lightheaded, pain, dizziness, headaches, rash.  Physical pain, R leg, worst pain in past 24 hours is #9.  Goal is anxiety and working on displacement.   Plans to work with MD/SW.  High anxiety and feeling dizzy.  Does have discharge plans.  Will discuss medications and displacement with MD. A:  Medications administered per MD orders.  Emotional support and encouragement given patient. R:  Patient denied HI while talking to nurse this morning.  SI off/on, contracts for safety, no plan.  Stated he does hear wind blowing and whistles.  Does see blurred visions, shadows. Safety maintained with 15 minute checks. Staff has continued to offer patient fluids/food throughout the day.  Patient has been drinking gatorade, ginger ale and water.

## 2018-12-10 NOTE — [facility] Group Notes (Signed)
[facility] Group Notes:  (Nursing/MHT/Case Management/Adjunct)  Date:  12/10/2018  Time:  4:00 pm  Type of Therapy:  Psychoeducational Skills  Participation Level:  Active  Participation Quality:  Appropriate  Affect:  Appropriate  Cognitive:  Appropriate  Insight:  Appropriate  Engagement in Group:  Engaged  Modes of Intervention:  Education  Summary of Progress/Problems: Patient was alert and active during group.    Earline Mayotte 12/10/2018, 6:43 PM

## 2018-12-10 NOTE — Progress Notes (Signed)
Nursing Progress Note: 7p-7a D: Pt currently presents with a irritable/agitated affect and behavior. Pt states "I have had a bad day. Sorry to disappoint. They decreased my sleeping meds, and I wasn't even able to sleep before ." Interacting minimally with the milieu. Pt reports poor sleep during the previous night with current medication regimen. Pt did attend wrap-up group.  A: Pt provided with medications per providers orders. Pt's labs and vitals were monitored throughout the night. Pt supported emotionally and encouraged to express concerns and questions. Pt educated on medications.  R: Pt's safety ensured with 15 minute and environmental checks. Pt currently endorses SI and AVH and denies HI. Pt verbally contracts to seek staff if SI,HI, or AVH occurs and to consult with staff before acting on any harmful thoughts. Will continue to monitor.

## 2018-12-10 NOTE — Plan of Care (Signed)
Nurse discussed anxiety, depression, coping skills with patient. 

## 2018-12-10 NOTE — Progress Notes (Signed)
DAR; Pt pleasant on approach , has been in the dayroom interacting with peers this evening. Attended wrap up group. Took all his medication as scheduled, reported good day. Maintained on routine safety checks.    Support and encouragement offered as needed.  Will continue to monitor.

## 2018-12-11 NOTE — [facility] Group Notes (Signed)
LCSW Group Therapy Note   12/11/2018  2:25 PM   Type of Therapy and Topic:  Group Therapy- Anger: Consequences and Cues   Participation Level:  Active     Description of Group:   In this group, patients defined and differentiated anger from other emotions and identified when and how anger becomes a problem. Patient's identified triggering events and their personal anger cues, secondary emotions to anger, and identified healthy coping mechanisms.    Therapeutic Goals: 1. Patients will identify and discuss consequences of anger and outbursts. 2. Patients will identify and discuss triggering events for anger. 3. Patients will explore other emotions that tend to fuel their secondary emotion of anger. 4. Patients will recognize their physical, behavioral, emotional, and cognitive anger cues. 5. Patients will identify coping skills for anger triggering emotions or events.    Summary of Patient Progress:   Tomoki quietly listened to others for the first half of group and responded to questions when prompted, patient began to share more toward the end of group. Patient states feeling disrespected is an anger cue and identified a physical anger cue as his nostrils flaring. Patient shares his coping skills are practicing martial arts.  Therapeutic Modalities:   Cognitive Behavioral Therapy Motivation Interviewing Solution Focused Therapy

## 2018-12-11 NOTE — Plan of Care (Signed)
Progress note  D: pt found in the dayroom; compliant with medication administration. Pt states he slept only 1 hour, but staff reported he slept 4.5 hours. Pt rates his depression/hopelessness/anxiety a 9.03/30/09 out of 10 respectively. Pt has been viewed laughing and joking with peers in the dayroom most of the day though. Pt has complaints of lightheadedness, dizziness, a rash, and lower leg pain. Pt denied any pain to this Clinical research associate. Pt states his goal for today is to work on getting the right meds and put together a better exit plan to help him in the future. Pt will achieve this by speaking with the doctor and social worker. Pt states he is still having passive si/hi/ah/vh but verbally agrees to approach staff if these become apparent or before harming himself or others while at [facility]. Pt was witnessed holding another pt's hand who is being discharged today. Redirection provided.  A: pt provided support and encouragement. Pt given medication per protocol and standing orders. Q39m safety checks implemented and continued.  R: pt safe on the unit. Will continue to monitor.   Pt progressing in the following metrics  Problem: Health Behavior/Discharge Planning: Goal: Identification of resources available to assist in meeting health care needs will improve Outcome: Progressing   Problem: Education: Goal: Knowledge of the prescribed therapeutic regimen will improve Outcome: Progressing   Problem: Activity: Goal: Imbalance in normal sleep/wake cycle will improve Outcome: Progressing   Problem: Coping: Goal: Coping ability will improve Outcome: Progressing

## 2018-12-11 NOTE — Progress Notes (Signed)
Nursing Progress Note: 7p-7a D: Pt currently presents with a irritable/agitated/pensive/pressured speech/depressed affect and behavior. Pt states "I have had a bad day. Sorry to disappoint. They decreased my sleeping meds, and I wasn't even able to sleep before ." Interacting minimally with the milieu. Pt reports poor sleep during the previous night with current medication regimen. Pt did attend wrap-up group.  A: Pt provided with medications per providers orders. Pt's labs and vitals were monitored throughout the night. Pt supported emotionally and encouraged to express concerns and questions. Pt educated on medications.  R: Pt's safety ensured with 15 minute and environmental checks. Pt currently endorses SI and AVH and denies HI. Pt verbally contracts to seek staff if SI,HI, or AVH occurs and to consult with staff before acting on any harmful thoughts. Will continue to monitor.
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
## 2018-12-11 NOTE — Progress Notes (Signed)
Adult Psychoeducational Group Note  Date:  12/11/2018 Time:  9:21 PM  Group Topic/Focus:  Wrap-Up Group:   The focus of this group is to help patients review their daily goal of treatment and discuss progress on daily workbooks.  Participation Level:  Active  Participation Quality:  Appropriate  Affect:  Appropriate  Cognitive:  Appropriate  Insight: Appropriate  Engagement in Group:  Engaged  Modes of Intervention:  Discussion  Additional Comments:  Patient attended wrap-up group and participated.   Micky Sheller W Haillee Johann 12/11/2018, 9:21 PM

## 2018-12-12 MED ORDER — NITROGLYCERIN 0.4 MG SL SUBL: 0.4000 mg | SUBLINGUAL_TABLET | SUBLINGUAL | 0 refills | Status: AC | PRN

## 2018-12-12 MED ORDER — HYDROXYZINE HCL 25 MG PO TABS: 25.0000 mg | ORAL_TABLET | Freq: Three times a day (TID) | ORAL | 0 refills | Status: AC | PRN

## 2018-12-12 MED ORDER — RIVAROXABAN 20 MG PO TABS: 20.0000 mg | ORAL_TABLET | Freq: Every day | ORAL | 0 refills | Status: AC

## 2018-12-12 MED ORDER — TRAZODONE HCL 50 MG PO TABS: 50.0000 mg | ORAL_TABLET | Freq: Every evening | ORAL | 0 refills | Status: AC | PRN

## 2018-12-12 MED ORDER — QUETIAPINE FUMARATE 100 MG PO TABS: 500.0000 mg | ORAL_TABLET | Freq: Every day | ORAL | 0 refills | Status: AC

## 2018-12-12 MED ORDER — QUETIAPINE FUMARATE 200 MG PO TABS
500.0000 mg | ORAL_TABLET | Freq: Every day | ORAL | Status: DC
  Filled 2018-12-12: qty 35

## 2018-12-12 MED ORDER — DULOXETINE HCL 60 MG PO CPEP: 60.0000 mg | ORAL_CAPSULE | Freq: Every day | ORAL | 0 refills | Status: AC

## 2018-12-12 MED ORDER — GABAPENTIN 400 MG PO CAPS: 400.0000 mg | ORAL_CAPSULE | Freq: Two times a day (BID) | ORAL | 0 refills | Status: AC

## 2018-12-12 NOTE — Progress Notes (Signed)
Patient discharged to lobby. Patient was stable and appreciative at that time. All papers, samples and prescriptions were given and valuables returned. Verbal understanding expressed. Denies SI/HI and A/VH. Patient given opportunity to express concerns and ask questions.  Suicide prevention leaflet discussed and given to patient upon discharge.

## 2018-12-12 NOTE — Progress Notes (Signed)
  [facility] Adult Case Management Discharge Plan :  Will you be returning to the same living situation after discharge:  Yes,  patient reports he is discharging to a family members home in [place], [place].  At discharge, do you have transportation home?: Yes,  bus passes Do you have the ability to pay for your medications: No.  Release of information consent forms completed and in the chart;  Patient's signature needed at discharge.  Patient to Follow up at: Follow-up Information    [name]. Go to.   Why:  For medication management and therapy services, please follow up within 3 days of discharge for continuity of care. Walk-in hours to receive services are Monday-Friday 8:00a.-5:00p. Be sure to bring photo ID, SSN and any discharge paperwork.   Contact information: [address] [place] [address] [phone]        [facility]. Call.   Why:  Please follow up within 3 days of discharge to schedule an appointment for primary care services. Be sure to mention your Xarelto prescription once you schedule an appointment.  Contact information: [address] [place] [address] [phone]          Next level of care provider has access to [facility] Link:yes  Safety Planning and Suicide Prevention discussed: Yes,  with the patient   Have you used any form of tobacco in the last 30 days? (Cigarettes, Smokeless Tobacco, Cigars, and/or Pipes): Yes  Has patient been referred to the Quitline?: Patient refused referral  Patient has been referred for addiction treatment: N/A  Maeola Sarah, LCSWA 12/12/2018, 2:03 PM
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
## 2018-12-12 NOTE — [facility] Suicide Risk Assessment (Signed)
[facility] Discharge Suicide Risk Assessment   Principal Problem: Depression Discharge Diagnoses: Active Problems:   Major depression with psychotic features ([name]   Total Time spent with patient: 30 minutes  Musculoskeletal: Strength & Muscle Tone: within normal limits Gait & Station: normal Patient leans: N/A  Psychiatric Specialty Exam: ROS no headache, no chest pain, no shortness of breath, no vomiting, no bleeding  Blood pressure 94/62, pulse 82, temperature 98.1 F (36.7 C), temperature source Oral, resp. rate 18, height 6' (1.829 m), weight 95.7 kg, SpO2 97 %.Body mass index is 28.62 kg/m.  General Appearance: Improving grooming  Eye Contact::  Good  Speech:  Normal Rate409  Volume:  Normal  Mood:  Overall acknowledges improved mood, states he feels better than on admission  Affect:  Appropriate and Full Range  Thought Process:  Linear and Descriptions of Associations: Intact  Orientation:  Full (Time, Place, and Person)  Thought Content:  Reports decreased but not resolved visual hallucinations, consisting of "seeing shadows".  Does not appear internally preoccupied.  No delusions are expressed  Suicidal Thoughts:  At this time denies suicidal ideations  Homicidal Thoughts:  No denies homicidal or violent ideations  Memory:  Recent and remote grossly intact  Judgement:  Fair/improving  Insight:  Fair and Improving  Psychomotor Activity:  Normal  Concentration:  Good  Recall:  Good  Fund of Knowledge:Good  Language: Good  Akathisia:  Negative  Handed:  Right  AIMS (if indicated):     Assets:  Communication Skills Desire for Improvement Resilience  Sleep:  Number of Hours: 4.75  Cognition: WNL  ADL's:  Intact   Mental Status Per Nursing Assessment::   On Admission:  Suicidal ideation indicated by patient  Demographic Factors:  47, single, 2 adult children, currently homeless, unemployed  Loss Factors: Homelessness, unemployment, limited support  network  Historical Factors: History of prior psychiatric admissions, history of depression, history of intermittent hallucinations  Risk Reduction Factors:   Positive coping skills or problem solving skills  Continued Clinical Symptoms:  At this time patient presents alert, attentive, calm, pleasant on approach, mood presents improved and affect is fuller in range, brighter, no thought disorder, denies suicidal ideations, denies homicidal or violent ideations, describes improved but not resolved hallucinations.  Currently does not present internally preoccupied.  No delusions expressed.  Future oriented, motivated and going to [employer], which he states helps address his major stressor (homelessness) No disruptive or agitated behaviors on unit, visible in dayroom interacting appropriately/laughing with peers. Currently denies medication side effects.    Cognitive Features That Contribute To Risk:  No gross cognitive deficits noted upon discharge. Is alert , attentive, and oriented x 3   Suicide Risk:  Mild:  Suicidal ideation of limited frequency, intensity, duration, and specificity.  There are no identifiable plans, no associated intent, mild dysphoria and related symptoms, good self-control (both objective and subjective assessment), few other risk factors, and identifiable protective factors, including available and accessible social support.  Follow-up Information    [name] Follow up.   Specialty:  Behavioral Health Why:  appt needed Contact information: [address] [place] [address] [phone]        [facility] Follow up.   Why:  appt needed  Contact information: [address] [place] [address] [phone]          Plan Of Care/Follow-up recommendations:  Activity:  as tolerated  Diet:  heart healthy Tests:  NA Other:  See below  Patient is expressing readiness for discharge,  interested in going to [facility]. He is leaving unit in good spirits.  Plans to follow-up locally for outpatient psychiatric management and also for ongoing medical management ( he is on Xarelto related to history of DVTs)   Craige Cotta, MD 12/12/2018, 9:33 AM

## 2019-01-23 ENCOUNTER — Other Ambulatory Visit: Payer: Self-pay

## 2019-01-23 ENCOUNTER — Encounter (HOSPITAL_COMMUNITY): Payer: Self-pay | Admitting: Emergency Medicine

## 2019-01-23 ENCOUNTER — Emergency Department (HOSPITAL_COMMUNITY)
Admission: EM | Admit: 2019-01-23 | Discharge: 2019-01-25 | Disposition: A | Discharge status: Home / Self Care | Payer: BLUE CROSS/BLUE SHIELD | Attending: Emergency Medicine | Admitting: Emergency Medicine

## 2019-01-23 DIAGNOSIS — Z79899 Other long term (current) drug therapy: Secondary | ICD-10-CM | POA: Insufficient documentation

## 2019-01-23 DIAGNOSIS — F333 Major depressive disorder, recurrent, severe with psychotic symptoms: Secondary | ICD-10-CM | POA: Insufficient documentation

## 2019-01-23 DIAGNOSIS — I10 Essential (primary) hypertension: Secondary | ICD-10-CM | POA: Diagnosis not present

## 2019-01-23 DIAGNOSIS — F17210 Nicotine dependence, cigarettes, uncomplicated: Secondary | ICD-10-CM | POA: Diagnosis not present

## 2019-01-23 DIAGNOSIS — Z91010 Allergy to peanuts: Secondary | ICD-10-CM | POA: Diagnosis not present

## 2019-01-23 DIAGNOSIS — R440 Auditory hallucinations: Secondary | ICD-10-CM | POA: Diagnosis present
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
## 2019-01-23 NOTE — ED Triage Notes (Signed)
Pt sent by [facility] Assessment center for suicidal ideation arrived IVC'd by [facility] but unable to manage pt and sent him to [facility] ED d/t  "inadequate staff to manage 1:1 ".  Pt admits to having suicidal ideation with vague plan, also admits to audio hallucinations denies commands. Currently calm and cooperative with flat affect but mood is stable behavior appropriately self controlled
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
## 2019-01-24 DIAGNOSIS — F333 Major depressive disorder, recurrent, severe with psychotic symptoms: Secondary | ICD-10-CM

## 2019-01-24 LAB — COMPREHENSIVE METABOLIC PANEL
ALT: 26 U/L (ref 0–44)
AST: 26 U/L (ref 15–41)
Albumin: 3.9 g/dL (ref 3.5–5.0)
Alkaline Phosphatase: 51 U/L (ref 38–126)
Anion gap: 7 (ref 5–15)
BUN: 15 mg/dL (ref 6–20)
CO2: 28 mmol/L (ref 22–32)
Calcium: 8.9 mg/dL (ref 8.9–10.3)
Chloride: 105 mmol/L (ref 98–111)
Creatinine, Ser: 0.94 mg/dL (ref 0.61–1.24)
GFR calc Af Amer: >60 mL/min (ref >60)
GFR calc non Af Amer: >60 mL/min (ref >60)
GLUCOSE: 104 mg/dL — AB (ref 70–99)
Potassium: 3.2 mmol/L — ABNORMAL LOW (ref 3.5–5.1)
Sodium: 140 mmol/L (ref 135–145)
Total Bilirubin: 1.1 mg/dL (ref 0.3–1.2)
Total Protein: 6.9 g/dL (ref 6.5–8.1)

## 2019-01-24 LAB — RAPID URINE DRUG SCREEN, HOSP PERFORMED
Amphetamines: NOT DETECTED
Barbiturates: NOT DETECTED
Benzodiazepines: NOT DETECTED
Cocaine: NOT DETECTED
OPIATES: NOT DETECTED
Tetrahydrocannabinol: NOT DETECTED

## 2019-01-24 LAB — SALICYLATE LEVEL: Salicylate Lvl: <7 mg/dL (ref 2.8–30.0)

## 2019-01-24 LAB — CBC
HCT: 40 % (ref 39.0–52.0)
HEMOGLOBIN: 12.8 g/dL — AB (ref 13.0–17.0)
MCH: 32.3 pg (ref 26.0–34.0)
MCHC: 32 g/dL (ref 30.0–36.0)
MCV: 101 fL — ABNORMAL HIGH (ref 80.0–100.0)
Platelets: 236 10*3/uL (ref 150–400)
RBC: 3.96 MIL/uL — ABNORMAL LOW (ref 4.22–5.81)
RDW: 13.6 % (ref 11.5–15.5)
WBC: 5.2 10*3/uL (ref 4.0–10.5)
nRBC: 0 % (ref 0.0–0.2)

## 2019-01-24 LAB — ACETAMINOPHEN LEVEL: Acetaminophen (Tylenol), Serum: <10 ug/mL — ABNORMAL LOW (ref 10–30)

## 2019-01-24 LAB — ETHANOL: Alcohol, Ethyl (B): <10 mg/dL (ref <10)

## 2019-01-24 MED ORDER — DULOXETINE HCL 30 MG PO CPEP
60.0000 mg | ORAL_CAPSULE | Freq: Every day | ORAL | Status: DC
  Administered 2019-01-24 – 2019-01-25 (×2): 60 mg via ORAL
  Filled 2019-01-24 (×2): qty 2

## 2019-01-24 MED ORDER — NITROGLYCERIN 0.4 MG SL SUBL: 0.4000 mg | SUBLINGUAL_TABLET | SUBLINGUAL | Status: DC | PRN

## 2019-01-24 MED ORDER — RIVAROXABAN 20 MG PO TABS
20.0000 mg | ORAL_TABLET | Freq: Every day | ORAL | Status: DC
  Administered 2019-01-24: 20 mg via ORAL
  Filled 2019-01-24 (×2): qty 1

## 2019-01-24 MED ORDER — POTASSIUM CHLORIDE CRYS ER 20 MEQ PO TBCR
40.0000 meq | EXTENDED_RELEASE_TABLET | Freq: Once | ORAL | Status: AC
  Administered 2019-01-24: 40 meq via ORAL
  Filled 2019-01-24: qty 2

## 2019-01-24 MED ORDER — ALUM & MAG HYDROXIDE-SIMETH 200-200-20 MG/5ML PO SUSP: 30.0000 mL | Freq: Four times a day (QID) | ORAL | Status: DC | PRN

## 2019-01-24 MED ORDER — HYDROXYZINE HCL 25 MG PO TABS
25.0000 mg | ORAL_TABLET | Freq: Three times a day (TID) | ORAL | Status: DC | PRN
  Administered 2019-01-24: 25 mg via ORAL
  Filled 2019-01-24: qty 1

## 2019-01-24 MED ORDER — GABAPENTIN 400 MG PO CAPS
400.0000 mg | ORAL_CAPSULE | Freq: Two times a day (BID) | ORAL | Status: DC
  Administered 2019-01-24 – 2019-01-25 (×3): 400 mg via ORAL
  Filled 2019-01-24 (×3): qty 1

## 2019-01-24 MED ORDER — ONDANSETRON HCL 4 MG PO TABS: 4.0000 mg | ORAL_TABLET | Freq: Three times a day (TID) | ORAL | Status: DC | PRN

## 2019-01-24 MED ORDER — TRAZODONE HCL 50 MG PO TABS: 50.0000 mg | ORAL_TABLET | Freq: Every evening | ORAL | Status: DC | PRN

## 2019-01-24 MED ORDER — QUETIAPINE FUMARATE 100 MG PO TABS
200.0000 mg | ORAL_TABLET | Freq: Every day | ORAL | Status: DC
  Administered 2019-01-24: 200 mg via ORAL
  Filled 2019-01-24: qty 2

## 2019-01-24 NOTE — ED Notes (Signed)
.  labs collected

## 2019-01-24 NOTE — Progress Notes (Signed)
01/24/2019  1021  Notified ER MD that patient reported ringing in his ears.

## 2019-01-24 NOTE — Progress Notes (Signed)
Received [place] this PM in his bed awake.He is calm and receptive with this Clinical research associate. He endorsed hearing voices at intervals, but the shallows are gone. He received his night time medications and slept throughout the night without incident.
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
## 2019-01-24 NOTE — ED Notes (Signed)
Pt presents with SI, no specific plan. And AVH. Seeing shadows and hearing whispers.  Feeling hopeless.  A&O x 3,  Calm & cooperative, no distress noted, Watching TV at present.
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
## 2019-01-25 NOTE — BH Assessment (Signed)
[facility] Assessment Progress Note  Per Juanetta Beets, DO, this pt does not require psychiatric hospitalization at this time.  Pt presents under IVC initiated by [facility] staff, which Dr Sharma Covert has rescinded.  Pt is to be discharged from [facility] with outpatient behavioral health referrals.  Discharge instructions include referral information for [facility] in the [place] area, or with [facility] in the [place] area.  Pt's nurse, Kendal Hymen, has been notified.  Doylene Canning, MA Triage Specialist [phone]

## 2019-01-25 NOTE — Discharge Instructions (Signed)
For your mental health needs, you are advised to follow up with one of the following providers:    IN [place]:       [name]      [address]      [place], [place] [address]      [phone]      New and returning patients are seen at their walk-in clinic.  Walk-in hours are Monday - Friday from 8:00 am - 3:00 pm.  Walk-in patients are seen on a first come, first served basis.  Try to arrive as early as possible for the best chance of being seen the same day.  IN [facility] [place]:   [facility]  [address] [place], [place] [address]  [phone]

## 2019-01-25 NOTE — ED Notes (Signed)
Pt discharged safely with discharge instructions.  All belongings were returned to patient. 

## 2019-12-23 ENCOUNTER — Inpatient Hospital Stay: Payer: Self-pay | Admitting: Family Medicine
